# Patient Record
Sex: Male | Born: 1970
Health system: Southern US, Community
[De-identification: ages and names within clinical notes are randomized; demographics above are authoritative.]

## PROBLEM LIST (undated history)

## (undated) DIAGNOSIS — K5090 Crohn's disease, unspecified, without complications: Secondary | ICD-10-CM

## (undated) DIAGNOSIS — G4733 Obstructive sleep apnea (adult) (pediatric): Secondary | ICD-10-CM

## (undated) DIAGNOSIS — K5790 Diverticulosis of intestine, part unspecified, without perforation or abscess without bleeding: Secondary | ICD-10-CM

## (undated) DIAGNOSIS — R7989 Other specified abnormal findings of blood chemistry: Secondary | ICD-10-CM

## (undated) DIAGNOSIS — D131 Benign neoplasm of stomach: Secondary | ICD-10-CM

## (undated) DIAGNOSIS — G7000 Myasthenia gravis without (acute) exacerbation: Secondary | ICD-10-CM

## (undated) DIAGNOSIS — E538 Deficiency of other specified B group vitamins: Secondary | ICD-10-CM

## (undated) DIAGNOSIS — E669 Obesity, unspecified: Secondary | ICD-10-CM

## (undated) DIAGNOSIS — K219 Gastro-esophageal reflux disease without esophagitis: Secondary | ICD-10-CM

## (undated) DIAGNOSIS — K2270 Barrett's esophagus without dysplasia: Secondary | ICD-10-CM

## (undated) DIAGNOSIS — I10 Essential (primary) hypertension: Secondary | ICD-10-CM

## (undated) DIAGNOSIS — K2970 Gastritis, unspecified, without bleeding: Secondary | ICD-10-CM

## (undated) DIAGNOSIS — F32A Depression, unspecified: Secondary | ICD-10-CM

## (undated) DIAGNOSIS — L0291 Cutaneous abscess, unspecified: Secondary | ICD-10-CM

## (undated) DIAGNOSIS — J309 Allergic rhinitis, unspecified: Secondary | ICD-10-CM

## (undated) DIAGNOSIS — K2210 Ulcer of esophagus without bleeding: Secondary | ICD-10-CM

## (undated) HISTORY — DX: Essential (primary) hypertension: I10

## (undated) HISTORY — DX: Ulcer of esophagus without bleeding: K22.10

## (undated) HISTORY — DX: Cutaneous abscess, unspecified: L02.91

## (undated) HISTORY — DX: Obstructive sleep apnea (adult) (pediatric): G47.33

## (undated) HISTORY — DX: Gastro-esophageal reflux disease without esophagitis: K21.9

## (undated) HISTORY — DX: Crohn's disease, unspecified, without complications: K50.90

## (undated) HISTORY — DX: Other specified abnormal findings of blood chemistry: R79.89

## (undated) HISTORY — DX: Deficiency of other specified B group vitamins: E53.8

## (undated) HISTORY — DX: Barrett's esophagus without dysplasia: K22.70

## (undated) HISTORY — DX: Obesity, unspecified: E66.9

## (undated) HISTORY — DX: Diverticulosis of intestine, part unspecified, without perforation or abscess without bleeding: K57.90

## (undated) HISTORY — DX: Myasthenia gravis without (acute) exacerbation: G70.00

## (undated) HISTORY — DX: Gastritis, unspecified, without bleeding: K29.70

## (undated) HISTORY — DX: Allergic rhinitis, unspecified: J30.9

## (undated) HISTORY — DX: Depression, unspecified: F32.A

## (undated) HISTORY — DX: Benign neoplasm of stomach: D13.1

---

## 2005-06-17 ENCOUNTER — Ambulatory Visit: Payer: Self-pay | Admitting: Internal Medicine

## 2008-03-15 ENCOUNTER — Encounter: Admission: RE | Admit: 2008-03-15 | Discharge: 2008-03-15 | Payer: Self-pay | Admitting: Internal Medicine

## 2010-08-25 ENCOUNTER — Encounter (INDEPENDENT_AMBULATORY_CARE_PROVIDER_SITE_OTHER): Payer: Self-pay | Admitting: General Surgery

## 2010-08-25 ENCOUNTER — Ambulatory Visit (INDEPENDENT_AMBULATORY_CARE_PROVIDER_SITE_OTHER): Payer: BC Managed Care – PPO | Admitting: General Surgery

## 2010-08-25 VITALS — BP 124/82 | HR 70 | Temp 99.0°F | Resp 20 | Wt 189.8 lb

## 2010-08-25 DIAGNOSIS — L0291 Cutaneous abscess, unspecified: Secondary | ICD-10-CM

## 2010-08-25 DIAGNOSIS — L02219 Cutaneous abscess of trunk, unspecified: Secondary | ICD-10-CM

## 2010-08-25 DIAGNOSIS — L02213 Cutaneous abscess of chest wall: Secondary | ICD-10-CM

## 2010-08-25 DIAGNOSIS — L03319 Cellulitis of trunk, unspecified: Secondary | ICD-10-CM

## 2010-08-25 MED ORDER — HYDROCODONE-ACETAMINOPHEN 5-500 MG PO TABS: 1.0000 | ORAL_TABLET | ORAL | Status: AC | PRN

## 2010-08-25 NOTE — Patient Instructions (Signed)
Daily wound packing twice daily. Return to ER for increased pain or redness.

## 2010-08-25 NOTE — Progress Notes (Signed)
Roy Ross is a 40 y.o. male.    Chief Complaint  Patient presents with  . Cyst    Abscess-chest wall    HPI HPI This patient was referred by Dr. polite for evaluation of acute left chest skin abscess. He has a history of prior chest cyst which was infected and it ruptured spontaneously in 2005. This most recent episode began approximately 1 week ago when he began having increased pain tenderness and redness around the left chest cyst which he states has been present for at least 12 years previously. He was seen in the urgent care where the cyst was aspirated and he was placed on doxycycline. He's been taking antibiotics but states that the lesion has increased in size and continues to cause discomfort with palpation. He denies any fevers. He had some drainage from the area after the prior aspiration but has not had drainage previously.  Past Medical History  Diagnosis Date  . Hypertension     No past surgical history on file.  No family history on file.  Social History History  Substance Use Topics  . Smoking status: Former Smoker    Types: Cigarettes    Quit date: 09/28/2006  . Smokeless tobacco: Not on file  . Alcohol Use: No    Allergies  Allergen Reactions  . Penicillins     Pt was told as child    Current Outpatient Prescriptions  Medication Sig Dispense Refill  . HYDROcodone-acetaminophen (VICODIN) 5-500 MG per tablet Take 1 tablet by mouth every 4 (four) hours as needed for pain.  30 tablet  0    Review of Systems Review of Systems  Constitutional: Negative.   HENT: Negative.   Eyes: Negative.   Respiratory: Negative.   Cardiovascular: Negative.   Gastrointestinal: Negative.   Genitourinary: Negative.   Musculoskeletal: Negative.   Skin: Negative.   Neurological: Negative.   Endo/Heme/Allergies: Negative.   Psychiatric/Behavioral: Negative.     Physical Exam Physical Exam  Constitutional: He is oriented to person, place, and time. He  appears well-developed and well-nourished. No distress.  HENT:  Head: Normocephalic and atraumatic.  Eyes: Pupils are equal, round, and reactive to light. Right eye exhibits no discharge. Left eye exhibits no discharge. No scleral icterus.  Neck: Normal range of motion. Neck supple. No tracheal deviation present.  Cardiovascular: Normal rate, regular rhythm and normal heart sounds.   Respiratory: Effort normal and breath sounds normal. No stridor. No respiratory distress. He has no wheezes. He has no rales.  GI: Soft. Bowel sounds are normal. He exhibits no distension. There is no tenderness.  Musculoskeletal: Normal range of motion. He exhibits no edema and no tenderness.  Neurological: He is alert and oriented to person, place, and time. He has normal reflexes.  Skin: Skin is warm and dry. No rash noted. He is not diaphoretic. There is erythema. No pallor.       3cm x 3cm left chest abscess, with central fluctuance and surrounding erythema, I/D performed today at the bedside. Wound packed and dressed.     Blood pressure 124/82, pulse 70, temperature 99 F (37.2 C), temperature source Temporal, resp. rate 20, weight 189 lb 12.8 oz (86.093 kg).  Assessment/Plan Left chest abscess with cellulitis  Incision and drainage of the left chest abscess was performed today in the clinic with improvement of his symptoms. After risks and benefits of the procedure were discussed with the patient and informed consent was obtained, the skin prepped with Betadine solution. It  was then anesthetized with 1% lidocaine with epinephrine and a dime sized area of skin was taken over the central fluctuance and the wound was probed and irrigated and packed with half-inch Nu Gauze. The wound is hemostatic and dressed with pressure dressing. There were no apparent complications and the patient tolerated procedure well. Wound cultures were taken.  He will followup tomorrow for further wound care, wound care instruction,  and wound packing. After that time he should be able to take of this wound at home. After the acute infection is resolved and the wound is healed we will plan for elective excision of this infected epidermal inclusion cyst.  Rejeana Fadness Herald 08/25/2010, 5:30 PM

## 2010-08-26 ENCOUNTER — Other Ambulatory Visit (INDEPENDENT_AMBULATORY_CARE_PROVIDER_SITE_OTHER): Payer: Self-pay | Admitting: General Surgery

## 2010-08-26 ENCOUNTER — Encounter (INDEPENDENT_AMBULATORY_CARE_PROVIDER_SITE_OTHER): Payer: BC Managed Care – PPO

## 2010-08-27 ENCOUNTER — Ambulatory Visit (INDEPENDENT_AMBULATORY_CARE_PROVIDER_SITE_OTHER): Payer: BC Managed Care – PPO

## 2010-08-29 LAB — WOUND CULTURE: Organism ID, Bacteria: NO GROWTH

## 2010-08-31 ENCOUNTER — Ambulatory Visit (INDEPENDENT_AMBULATORY_CARE_PROVIDER_SITE_OTHER): Payer: BC Managed Care – PPO | Admitting: General Surgery

## 2010-08-31 ENCOUNTER — Encounter (INDEPENDENT_AMBULATORY_CARE_PROVIDER_SITE_OTHER): Payer: Self-pay | Admitting: General Surgery

## 2010-08-31 VITALS — BP 132/96 | HR 60 | Temp 98.6°F

## 2010-08-31 DIAGNOSIS — L089 Local infection of the skin and subcutaneous tissue, unspecified: Secondary | ICD-10-CM

## 2010-08-31 DIAGNOSIS — L723 Sebaceous cyst: Secondary | ICD-10-CM

## 2010-08-31 NOTE — Progress Notes (Signed)
Subjective:     Patient ID: Roy Ross, male   DOB: [birth year], 40 y.o.   MRN: [id number]  HPI He follows up today status post incision and drainage of left chest abscess. He has been doing his wound care with daily wet to dry dressings. The wound is healing well and he has no complaints. Denies any fevers, or chills. He is taking doxycycline which he just filled a second prescription.  Review of Systems     Objective:   Physical Exam The wound is healing well. There is no residual sign of infection. He has a 1.5 cm x 1.5 cm wide shallow cavity which is not really amenable to further wound packing. Again this is healing nicely and should completely resolve soon. There is no evidence of undrained fluid collection    Assessment:     S/p I/D of infected sebaceous cyst-Doing well     Plan:     He is doing well. The abscess cavity it is nearly healed. Minimal wound care should be needed now. I suspect that this will be completely healed within the next 2 weeks. I recommended that he followup in 2 months for further evaluation and to schedule elective sebaceous cyst excision to prevent recurrent infection since this is his second episode.

## 2010-10-16 ENCOUNTER — Encounter (INDEPENDENT_AMBULATORY_CARE_PROVIDER_SITE_OTHER): Payer: Self-pay | Admitting: General Surgery

## 2011-02-04 ENCOUNTER — Ambulatory Visit (INDEPENDENT_AMBULATORY_CARE_PROVIDER_SITE_OTHER): Payer: BC Managed Care – PPO

## 2011-02-04 DIAGNOSIS — B356 Tinea cruris: Secondary | ICD-10-CM

## 2011-02-15 ENCOUNTER — Ambulatory Visit (INDEPENDENT_AMBULATORY_CARE_PROVIDER_SITE_OTHER): Payer: BC Managed Care – PPO

## 2011-02-15 DIAGNOSIS — H6120 Impacted cerumen, unspecified ear: Secondary | ICD-10-CM

## 2011-02-15 DIAGNOSIS — H9209 Otalgia, unspecified ear: Secondary | ICD-10-CM

## 2011-06-01 ENCOUNTER — Ambulatory Visit (INDEPENDENT_AMBULATORY_CARE_PROVIDER_SITE_OTHER): Payer: BC Managed Care – PPO | Admitting: Family Medicine

## 2011-06-01 ENCOUNTER — Ambulatory Visit: Payer: BC Managed Care – PPO

## 2011-06-01 VITALS — BP 132/83 | HR 70 | Temp 98.6°F | Resp 16 | Ht 66.5 in | Wt 192.8 lb

## 2011-06-01 DIAGNOSIS — S161XXA Strain of muscle, fascia and tendon at neck level, initial encounter: Secondary | ICD-10-CM

## 2011-06-01 DIAGNOSIS — M542 Cervicalgia: Secondary | ICD-10-CM

## 2011-06-01 DIAGNOSIS — S139XXA Sprain of joints and ligaments of unspecified parts of neck, initial encounter: Secondary | ICD-10-CM

## 2011-06-01 MED ORDER — OXAPROZIN 600 MG PO TABS: 600.0000 mg | ORAL_TABLET | Freq: Two times a day (BID) | ORAL | Status: AC

## 2011-06-01 MED ORDER — METHOCARBAMOL 750 MG PO TABS: 750.0000 mg | ORAL_TABLET | Freq: Four times a day (QID) | ORAL | Status: AC
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
## 2011-06-01 NOTE — Patient Instructions (Signed)

## 2011-07-06 ENCOUNTER — Ambulatory Visit: Payer: BC Managed Care – PPO | Admitting: Family Medicine

## 2011-07-06 VITALS — BP 123/75 | HR 78 | Temp 98.6°F | Resp 16 | Ht 68.5 in | Wt 198.6 lb

## 2011-07-06 DIAGNOSIS — Z0289 Encounter for other administrative examinations: Secondary | ICD-10-CM

## 2011-07-06 NOTE — Progress Notes (Signed)
  Subjective:    Patient ID: Glennie Hawk, male    DOB: 02-01-1971, 41 y.o.   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  Has hypertension - on norvasc.  Well-controlled.  No concerns.    Review of Systems Negative except as per HPI     Objective:   Physical Exam  Constitutional: Vital signs are normal. He appears well-developed and well-nourished. He is active.  HENT:  Head: Normocephalic.  Right Ear: Hearing, tympanic membrane, external ear and ear canal normal.  Left Ear: Hearing, tympanic membrane, external ear and ear canal normal.  Nose: Nose normal.  Mouth/Throat: Uvula is midline, oropharynx is clear and moist and mucous membranes are normal.  Eyes: Conjunctivae and EOM are normal. Pupils are equal, round, and reactive to light.  Neck: No mass and no thyromegaly present.  Cardiovascular: Normal rate, regular rhythm, normal heart sounds, intact distal pulses and normal pulses.   Pulmonary/Chest: Effort normal and breath sounds normal.  Abdominal: Soft. Normal appearance and bowel sounds are normal. There is no hepatosplenomegaly. There is no tenderness. There is no CVA tenderness. No hernia. Hernia confirmed negative in the right inguinal area and confirmed negative in the left inguinal area.  Genitourinary: Testes normal and penis normal.  Lymphadenopathy:    He has no cervical adenopathy.    He has no axillary adenopathy.  Neurological: He is alert.          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

## 2012-01-20 ENCOUNTER — Ambulatory Visit (INDEPENDENT_AMBULATORY_CARE_PROVIDER_SITE_OTHER): Payer: BC Managed Care – PPO | Admitting: Physician Assistant

## 2012-01-20 VITALS — BP 146/87 | HR 72 | Temp 98.1°F | Resp 16 | Ht 67.0 in | Wt 201.4 lb

## 2012-01-20 DIAGNOSIS — S01309A Unspecified open wound of unspecified ear, initial encounter: Secondary | ICD-10-CM

## 2012-01-20 DIAGNOSIS — H6120 Impacted cerumen, unspecified ear: Secondary | ICD-10-CM

## 2012-01-20 DIAGNOSIS — H9190 Unspecified hearing loss, unspecified ear: Secondary | ICD-10-CM

## 2012-01-20 MED ORDER — MUPIROCIN 2 % EX OINT: TOPICAL_OINTMENT | Freq: Three times a day (TID) | CUTANEOUS | Status: DC

## 2012-01-20 NOTE — Patient Instructions (Addendum)
Discontinue use of Q-tips, hair pins, keys, etc.   To help prevent cerumen impaction: While in the washing hair in the shower, allow soapy water to run into ear canal for a few minutes and then rinse (it dissolves the wax like a dishwasher dissolves grease).   May also use half hydrogen peroxide half water in the shower 2-3 times per week to help rinse out the wax. DO NOT USE 100% HYDROGEN PEROXIDE (this can burn the skin).   Also, several drops of sweet oil can be used to help prevent itching of the canal. If this is not enough to decrease itch, you may put a small amount of OTC hydrocortisone cream on pinky finger and apply to portion of canal that can be reached with tip of finger.  Recommend OTC Debrox or Colace to prevent impaction.  

## 2012-01-20 NOTE — Progress Notes (Signed)
  Subjective:    Patient ID: Roy Ross, male    DOB: 1970-03-03, 41 y.o.   MRN: [id number]  HPI 41 year old male presents with 4-5 day history of right ear fullness, pressure, and decreased hearing.  Has had a history of problems with cerumen impaction in the past and has had to have them irrigated. States his left ear is not bothering him but likely could be impacted as well.  Denies nasal congestion, rhinorrhea, cough, postnasal drainage, fevers, chills, headache, tinnitus, or dizziness.  He also complains of dry skin on bilateral ears. Typically uses nizoral shampoo and a steroid cream that helps.  States behind his right auricle there is an open area that seems to be worsening. He has been using his creams but thinks it may be infected.  Is concerned about staph since he has had this in the past.     Review of Systems  Constitutional: Negative for fever and chills.  HENT: Negative for ear pain, congestion, sore throat, rhinorrhea, postnasal drip, tinnitus and ear discharge.   Respiratory: Negative for cough.   Gastrointestinal: Negative for nausea and vomiting.  Skin: Positive for rash (behind right ear) and wound.  Neurological: Negative for dizziness, light-headedness and headaches.  All other systems reviewed and are negative.       Objective:   Physical Exam  Constitutional: He is oriented to person, place, and time. He appears well-developed and well-nourished.  HENT:  Head: Normocephalic and atraumatic.  Right Ear: Hearing normal.  Left Ear: Hearing normal.       Right posterior auricle has erythematous, honey-colored crust with slight purulent drainage. Bilateral ears are dry and scaly.   Cerumen impaction bilaterally. Normal TM's revealed s/p ear irrigation.  Eyes: Conjunctivae normal and EOM are normal.  Neck: Normal range of motion. Neck supple.  Cardiovascular: Normal rate.   Pulmonary/Chest: Effort normal.  Lymphadenopathy:    He has no cervical adenopathy.   Neurological: He is alert and oriented to person, place, and time.  Psychiatric: He has a normal mood and affect. His behavior is normal. Judgment and thought content normal.          Assessment & Plan:   1. Wound, open, ear  Wound culture  2. Cerumen impaction    Ear care instructions provided D/C steroid cream Mupirocin bid x 7 days Culture sent Recommend OTC moisturizer to help with dry skin.  Will send in rx for doxycycline if no improvement with mupirocin at the time of culture results.

## 2012-01-25 LAB — WOUND CULTURE: Gram Stain: NONE SEEN

## 2012-01-27 ENCOUNTER — Telehealth: Payer: Self-pay

## 2012-01-27 MED ORDER — DOXYCYCLINE HYCLATE 100 MG PO TABS: 100.0000 mg | ORAL_TABLET | Freq: Two times a day (BID) | ORAL | Status: DC

## 2012-01-27 NOTE — Telephone Encounter (Signed)
Rx for doxy sent to pharmacy.

## 2012-01-27 NOTE — Telephone Encounter (Signed)
Your note indicates if not better will send in doxy? okay

## 2012-01-27 NOTE — Telephone Encounter (Signed)
Pt still not any better and would like something different called in   Best number [phone]

## 2012-01-28 NOTE — Telephone Encounter (Signed)
Thanks I have called him to advise.  

## 2012-07-06 ENCOUNTER — Ambulatory Visit: Payer: Self-pay | Admitting: Family Medicine

## 2012-07-06 VITALS — BP 122/80 | HR 78 | Temp 97.5°F | Resp 16 | Ht 67.0 in | Wt 206.0 lb

## 2012-07-06 DIAGNOSIS — Z0289 Encounter for other administrative examinations: Secondary | ICD-10-CM

## 2012-07-06 DIAGNOSIS — I10 Essential (primary) hypertension: Secondary | ICD-10-CM

## 2012-07-06 DIAGNOSIS — F411 Generalized anxiety disorder: Secondary | ICD-10-CM

## 2012-07-06 DIAGNOSIS — Z029 Encounter for administrative examinations, unspecified: Secondary | ICD-10-CM

## 2012-07-06 HISTORY — DX: Generalized anxiety disorder: F41.1

## 2012-07-06 HISTORY — DX: Essential (primary) hypertension: I10

## 2012-07-06 LAB — POCT URINALYSIS DIPSTICK
Glucose, UA: NEGATIVE
Ketones, UA: NEGATIVE
Protein, UA: NEGATIVE
Urobilinogen, UA: NEGATIVE

## 2012-07-06 NOTE — Progress Notes (Signed)
[facility] [address] [place] [address] [phone]  Date:  07/06/2012   Name:  Roy Ross   DOB:  11-29-1970   MRN:  [id number]  PCP:  Georgann Housekeeper, MD    Chief Complaint: No chief complaint on file.   History of Present Illness:  Roy Ross is a 42 y.o. very pleasant male patient who presents with the following: Patient presents today for a DOT physical exam. No complaints today- states that he feels fine. 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  He takes norvasc for HTN, and celexa for anxiety.  His anxiety is not severe, it is well controlled with his medication per his PCP Dr. Eula Listen.  He has never been hospitalized or had severe anxiety symptoms.   There are no active problems to display for this patient.   Past Medical History  Diagnosis Date  . Hypertension   . Abscess     pectoralis major    No past surgical history on file.  History  Substance Use Topics  . Smoking status: Former Smoker    Types: Cigarettes    Quit date: 09/28/2006  . Smokeless tobacco: Not on file  . Alcohol Use: No    Family History  Problem Relation Age of Onset  . Hypertension Father   . Heart disease Father     pacemaker  . Diabetes Father   . Cancer Father     bladder    Allergies  Allergen Reactions  . Penicillins     Pt was told as child    Medication list has been reviewed and updated.  Current Outpatient Prescriptions on File Prior to Visit  Medication Sig Dispense Refill  . amLODipine (NORVASC) 5 MG tablet Daily.      . citalopram (CELEXA) 40 MG tablet Daily.      Marland Kitchen doxycycline (VIBRA-TABS) 100 MG tablet Take 1 tablet (100 mg total) by mouth 2 (two) times daily.  20 tablet  0  . mupirocin ointment (BACTROBAN) 2 % Apply topically 3 (three) times daily.  22 g  0   No current facility-administered medications on file prior to visit.    Review of Systems:  Patient denies headache, maise,  fever. Denies chest pain, shortness of breath, difficulty with ROM. Denies tremor, syncope, and dizziness. No complaints. Review of symptoms otherwise negative. Patient states that he compliant with his blood pressure and anti-anxiety medications. States that both medications are helpful as used for. Denies anxiety interfering with work.    Physical Examination: Filed Vitals:   07/06/12 1601  BP: 122/80  Pulse: 78  Temp: 97.5 F (36.4 C)  Resp: 16   Filed Vitals:   07/06/12 1601  Height: 5\' 7"  (1.702 m)  Weight: 206 lb (93.441 kg)   Body mass index is 32.26 kg/(m^2). Ideal Body Weight: Weight in (lb) to have BMI = 25: 159.3  GEN: WDWN, NAD, Non-toxic, A & O x 3 HEENT: Atraumatic, Normocephalic. Neck supple. No masses, No LAD.  Bilateral TM wnl, oropharynx normal.  PEERL,EOMI.   Ears and Nose: No external deformity. CV: RRR, No M/G/R. No JVD. No thrill. No extra heart sounds. PULM: CTA B, no wheezes, crackles, rhonchi. No retractions. No resp. distress. No accessory muscle use. ABD: S, NT, ND. No rebound. No HSM. EXTR: No c/c/e NEURO Normal gait.  Normal strength, ROM and DTR all extremities PSYCH: Normally interactive. Conversant. Not depressed or anxious appearing.  Calm demeanor.  GU: no inguinal hernia, genitalia normal  Assessment and Plan: Assessment: DOT EXAM  Plan: Continue with yearly physical & immunizations per schedule           Continue with medications as ordered          Follow up with PCP with any complaints as needed Alerted to trace hematuria on dip today- asked him to please follow- up with Dr. Eula Listen regarding this finding.  It may need to be repeated  Signed Abbe Amsterdam, MD

## 2012-08-26 ENCOUNTER — Encounter: Payer: Self-pay | Admitting: Family Medicine

## 2012-09-01 ENCOUNTER — Ambulatory Visit (INDEPENDENT_AMBULATORY_CARE_PROVIDER_SITE_OTHER): Payer: BC Managed Care – PPO | Admitting: Emergency Medicine

## 2012-09-01 VITALS — BP 152/82 | HR 74 | Temp 98.0°F | Resp 16 | Ht 67.0 in | Wt 207.0 lb

## 2012-09-01 DIAGNOSIS — N50811 Right testicular pain: Secondary | ICD-10-CM

## 2012-09-01 DIAGNOSIS — N509 Disorder of male genital organs, unspecified: Secondary | ICD-10-CM

## 2012-09-01 LAB — POCT UA - MICROSCOPIC ONLY
Casts, Ur, LPF, POC: NEGATIVE
Crystals, Ur, HPF, POC: NEGATIVE
Epithelial cells, urine per micros: NEGATIVE
Mucus, UA: NEGATIVE

## 2012-09-01 LAB — POCT URINALYSIS DIPSTICK
Protein, UA: 30
Spec Grav, UA: >=1.03
Urobilinogen, UA: 0.2
pH, UA: 5.5

## 2012-09-01 MED ORDER — DOXYCYCLINE HYCLATE 100 MG PO TABS: 100.0000 mg | ORAL_TABLET | Freq: Two times a day (BID) | ORAL | Status: DC

## 2012-09-01 NOTE — Progress Notes (Signed)
  Subjective:    Patient ID: Roy Ross, male    DOB: 10-02-1970, 42 y.o.   MRN: [id number]  HPI Pt has had some pain in his right testicle. THis has been going on for the last 2 weeks. He doesn't think that it is swelling. No pain with urination. He says that when he saw Dr Patsy Lager last time, she said he had blood in his urine. He does a lot of heavy lifting, pushing things around at work.    Review of Systems     Objective:   Physical Exam abdomen is soft. Liver spleen are not large. Examination of genitourinary reveals a normal genitals. Testes are normal. There is no hernia felt. There is tenderness along the cord the right testicle  Results for orders placed in visit on 07/06/12  POCT URINALYSIS DIPSTICK      Result Value Range   Color, UA yellow     Clarity, UA clear     Glucose, UA neg     Bilirubin, UA neg     Ketones, UA neg     Spec Grav, UA 1.015     Blood, UA trace     pH, UA       Protein, UA neg     Urobilinogen, UA negative     Nitrite, UA neg     Leukocytes, UA Negative     Prostate exam reveals a normal-sized prostate which is nontender .      Assessment & Plan:  Patient has evidence of a right epididymitis. Will treat with doxycycline and schedule ultrasound of the right testicle.

## 2012-09-05 ENCOUNTER — Other Ambulatory Visit: Payer: Self-pay | Admitting: Radiology

## 2012-09-05 DIAGNOSIS — N50819 Testicular pain, unspecified: Secondary | ICD-10-CM

## 2012-10-10 ENCOUNTER — Telehealth: Payer: Self-pay

## 2012-10-10 NOTE — Telephone Encounter (Signed)
Pt states we referred him for an ultrasound and he cancelled it due to the pain going away,but now it has returned and he would like to be referred again

## 2012-10-11 NOTE — Telephone Encounter (Signed)
Please reschedule his testicular ultrasound and Doppler.

## 2012-10-11 NOTE — Telephone Encounter (Signed)
Checking with Lupita Leash to see if he can reschedule without another referral.

## 2013-02-22 ENCOUNTER — Emergency Department (HOSPITAL_COMMUNITY)
Admission: EM | Admit: 2013-02-22 | Discharge: 2013-02-22 | Disposition: A | Discharge status: Home / Self Care | Payer: BC Managed Care – PPO | Source: Home / Self Care | Attending: Family Medicine | Admitting: Family Medicine

## 2013-02-22 ENCOUNTER — Encounter (HOSPITAL_COMMUNITY): Payer: Self-pay | Admitting: Emergency Medicine

## 2013-02-22 DIAGNOSIS — S91209A Unspecified open wound of unspecified toe(s) with damage to nail, initial encounter: Secondary | ICD-10-CM

## 2013-02-22 DIAGNOSIS — W240XXA Contact with lifting devices, not elsewhere classified, initial encounter: Secondary | ICD-10-CM

## 2013-02-22 DIAGNOSIS — S91109A Unspecified open wound of unspecified toe(s) without damage to nail, initial encounter: Secondary | ICD-10-CM

## 2013-02-22 NOTE — ED Provider Notes (Signed)
Medical screening examination/treatment/procedure(s) were performed by resident physician or non-physician practitioner and as supervising physician I was immediately available for consultation/collaboration.   Londen Bok DOUGLAS MD.   Elisheba Mcdonnell D Marielouise Amey, MD 02/22/13 2056 

## 2013-02-22 NOTE — ED Provider Notes (Signed)
CSN: [id number]     Arrival date & time 02/22/13  1657 History   First MD Initiated Contact with Patient 02/22/13 1727     Chief Complaint  Patient presents with  . Toe Injury   (Consider location/radiation/quality/duration/timing/severity/associated sxs/prior Treatment) HPI Comments: 43 year old male presents complaining of injury to his right great toe. This morning he was using a dolly and it rolled onto his toe and bent his toenail back. The pain is mild to moderate. There was a small amount of bleeding initially but that has stopped. No other injury. The pain is not increased with ambulation. There is no numbness in the toe. He does not think it is broken    Past Medical History  Diagnosis Date  . Hypertension   . Abscess     pectoralis major   History reviewed. No pertinent past surgical history. Family History  Problem Relation Age of Onset  . Hypertension Father   . Heart disease Father     pacemaker  . Diabetes Father   . Cancer Father     bladder   History  Substance Use Topics  . Smoking status: Former Smoker    Types: Cigarettes    Quit date: 09/28/2006  . Smokeless tobacco: Not on file  . Alcohol Use: No    Review of Systems  Constitutional: Negative for fever, chills and fatigue.  HENT: Negative for sore throat.   Eyes: Negative for visual disturbance.  Respiratory: Negative for cough and shortness of breath.   Cardiovascular: Negative for chest pain, palpitations and leg swelling.  Gastrointestinal: Negative for nausea, vomiting, abdominal pain, diarrhea and constipation.  Genitourinary: Negative for dysuria, urgency, frequency and hematuria.  Musculoskeletal: Positive for arthralgias. Negative for myalgias, neck pain and neck stiffness.       Toenail injury  Skin: Negative for rash.  Neurological: Negative for dizziness, weakness and light-headedness.    Allergies  Penicillins  Home Medications   Current Outpatient Rx  Name  Route  Sig  Dispense   Refill  . amLODipine (NORVASC) 5 MG tablet      Daily.         . citalopram (CELEXA) 40 MG tablet      Daily.         Marland Kitchen doxycycline (VIBRA-TABS) 100 MG tablet   Oral   Take 1 tablet (100 mg total) by mouth 2 (two) times daily.   20 tablet   0   . mupirocin ointment (BACTROBAN) 2 %   Topical   Apply topically 3 (three) times daily.   22 g   0    BP 139/81  Pulse 78  Temp(Src) 98.8 F (37.1 C) (Oral)  Resp 16  SpO2 97% Physical Exam  Nursing note and vitals reviewed. Constitutional: He is oriented to person, place, and time. He appears well-developed and well-nourished. No distress.  HENT:  Head: Normocephalic.  Pulmonary/Chest: Effort normal. No respiratory distress.  Musculoskeletal:       Feet:  Neurological: He is alert and oriented to person, place, and time. Coordination normal.  Skin: Skin is warm and dry. No rash noted. He is not diaphoretic.  Psychiatric: He has a normal mood and affect. Judgment normal.    ED Course  Procedures (including critical care time) Labs Review Labs Reviewed - No data to display Imaging Review No results found.    MDM   1. Toenail avulsion    End of toenail removed, no anesthesia required.  Dressed.  Tylenol or IB2 PRN.  Keep covered, f/u if any signs of infection.         Graylon GoodZachary H Shey Yott, PA-C 02/22/13 [phone]

## 2013-02-22 NOTE — Discharge Instructions (Signed)
Fingernail or Toenail Loss  All or part of your fingernail or toenail has been lost. This may or may not grow back as a normal nail. A special non-stick bandage has been put on your finger or toe tightly to prevent bleeding.  HOME CARE INSTRUCTIONS   The tips of fingers and toes are full of nerves and injuries are often very painful. The following will help you decrease the pain and obtain the best outcome.  · Keep your hand or foot elevated above your heart to relieve pain and swelling. This will require lying in bed or on a couch with the hand or leg on pillows or sitting in a recliner with the leg up. Letting your hand or leg dangle may increase swelling, slow healing and cause throbbing pain.  · Keep your dressing dry and clean.  · Change your bandage in 24 hours after going home.  · After your bandage is changed, soak your hand or foot in warm soapy water for 10 to 20 minutes. Do this 3 times per day. This helps reduce pain and swelling. After soaking, apply a clean, dry bandage. Change your bandage if it is wet or dirty.  · Only take over-the-counter or prescription medicines for pain, discomfort, or fever as directed by your caregiver.  · See your caregiver as needed for problems.  SEEK IMMEDIATE MEDICAL CARE IF:   · You have increased pain, swelling, drainage, or bleeding.  · You have a fever.  MAKE SURE YOU:   · Understand these instructions.  · Will watch your condition.  · Will get help right away if you are not doing well or get worse.  Document Released: 12/17/2005 Document Revised: 04/19/2011 Document Reviewed: 03/08/2006  [employer]® Patient Information ©2014 [employer].

## 2013-02-22 NOTE — ED Notes (Signed)
Pt c/o right greater toe inj onset today around 1430 Reports he was moving a heavy copier via motorized dolly and his toe was caught in between Sxs include pain that is 5/10, bleeding and toe nail raised/partial avulsion  Alert w/no signs of acute distress.

## 2013-06-04 ENCOUNTER — Other Ambulatory Visit: Payer: Self-pay | Admitting: Internal Medicine

## 2013-06-04 ENCOUNTER — Ambulatory Visit
Admission: RE | Admit: 2013-06-04 | Discharge: 2013-06-04 | Disposition: A | Discharge status: Home / Self Care | Payer: BC Managed Care – PPO | Source: Ambulatory Visit | Attending: Internal Medicine | Admitting: Internal Medicine

## 2013-06-04 DIAGNOSIS — R05 Cough: Secondary | ICD-10-CM

## 2013-06-04 DIAGNOSIS — R059 Cough, unspecified: Secondary | ICD-10-CM

## 2013-07-03 ENCOUNTER — Ambulatory Visit: Payer: Self-pay | Admitting: Physician Assistant

## 2013-07-03 VITALS — BP 128/78 | HR 83 | Temp 98.5°F | Resp 18 | Ht 66.5 in | Wt 205.4 lb

## 2013-07-03 DIAGNOSIS — Z0289 Encounter for other administrative examinations: Secondary | ICD-10-CM

## 2013-07-03 NOTE — Progress Notes (Signed)
Patient ID: Roy Ross MRN: [id number], DOB: Apr 23, 1970 43 y.o. Date of Encounter: 07/03/2013, 5:10 PM  Primary Physician: Georgann HousekeeperHUSAIN,KARRAR, MD  Chief Complaint: DOT Physical   HPI: 43 y.o. male with history noted below here for DOT physical. Doing well. No issues/complaints. Recertification. Currently taking amlodipine 5 mg daily for HTN and Celexa 40 mg daily for anxiety. Tolerating both without issues. PCP is Dr. Donette LarryHusain. Last saw Dr. Donette LarryHusain 1 week ago for regular CPE. Labs were done, hasn't heard back on them yet. No CP, chest tightness, headaches, vision changes, or focal deficits. No SI or HI.   Just had a sleep study during winter and never heard anything. Finally got his CPAP a week ago, 06/28/13 and has been using it nightly since. Has follow up appointment with Dr. Donette LarryHusain in July to assess CPAP.   Review of Systems: Consitutional: No fever, chills, fatigue, night sweats, lymphadenopathy, or weight changes. Eyes: No visual changes, eye redness, or discharge. ENT/Mouth: Ears: No otalgia, tinnitus, hearing loss, discharge. Nose: No congestion, rhinorrhea, sinus pain, or epistaxis. Throat: No sore throat, post nasal drip, or teeth pain. Cardiovascular: No CP, palpitations, diaphoresis, DOE, edema, orthopnea, PND. Respiratory: No cough, hemoptysis, SOB, or wheezing. Gastrointestinal: No anorexia, dysphagia, reflux, pain, nausea, vomiting, hematemesis, diarrhea, constipation, BRBPR, or melena. Genitourinary: No dysuria, frequency, urgency, hematuria, incontinence, nocturia, decreased urinary stream, discharge, impotence, or testicular pain/masses. Musculoskeletal: No decreased ROM, myalgias, stiffness, joint swelling, or weakness. Skin: No rash, erythema, lesion changes, pain, warmth, jaundice, or pruritis. Neurological: No headache, dizziness, syncope, seizures, tremors, memory loss, coordination problems, or paresthesias. Psychological: Positive for well controlled anxiety. No  depression, hallucinations, SI/HI. Endocrine: No fatigue, polydipsia, polyphagia, polyuria, or known diabetes. All other systems were reviewed and are otherwise negative.  Past Medical History  Diagnosis Date  . Hypertension   . Abscess     pectoralis major     No past surgical history on file.  Home Meds:  Prior to Admission medications   Medication Sig Start Date End Date Taking? Authorizing Provider  amLODipine (NORVASC) 5 MG tablet Daily. 08/14/10  Yes Historical Provider, MD  citalopram (CELEXA) 40 MG tablet Daily. 08/10/10  Yes Historical Provider, MD    Allergies:  Allergies  Allergen Reactions  . Penicillins     Pt was told as child    History   Social History  . Marital Status: Single    Spouse Name: N/A    Number of Children: N/A  . Years of Education: N/A   Occupational History  . Not on file.   Social History Main Topics  . Smoking status: Former Smoker    Types: Cigarettes    Quit date: 09/28/2006  . Smokeless tobacco: Not on file  . Alcohol Use: No  . Drug Use: No  . Sexual Activity: Not Currently   Other Topics Concern  . Not on file   Social History Narrative  . No narrative on file    Family History  Problem Relation Age of Onset  . Hypertension Father   . Heart disease Father     pacemaker  . Diabetes Father   . Cancer Father     bladder    Physical Exam: Blood pressure 128/78, pulse 83, temperature 98.5 F (36.9 C), temperature source Oral, resp. rate 18, height 5' 6.5" (1.689 m), weight 205 lb 6.4 oz (93.169 kg), SpO2 99.00%.  General: Well developed, well nourished, in no acute distress. HEENT: Normocephalic, atraumatic. Conjunctiva pink, sclera non-icteric. Pupils 2  mm constricting to 1 mm, round, regular, and equally reactive to light and accomodation. EOMI. Internal auditory canal clear. TMs with good cone of light and without pathology. Nasal mucosa pink. Nares are without discharge. No sinus tenderness. Oral mucosa pink.  Dentition normal. Pharynx without exudate.   Neck: Supple. Trachea midline. No thyromegaly. Full ROM. No lymphadenopathy. Lungs: Clear to auscultation bilaterally without wheezes, rales, or rhonchi. Breathing is of normal effort and unlabored. Cardiovascular: RRR with S1 S2. No murmurs, rubs, or gallops appreciated. Distal pulses 2+ symmetrically. No carotid or abdominal bruits. Abdomen: Soft, non-tender, non-distended with normoactive bowel sounds. No hepatosplenomegaly or masses. No rebound/guarding. No CVA tenderness. Without hernias.  Genitourinary: Circumcised male. No penile lesions. Testes descended bilaterally, and smooth without tenderness or masses.  Musculoskeletal: Full range of motion and 5/5 strength throughout. Without swelling, atrophy, tenderness, crepitus, or warmth. Extremities without clubbing, cyanosis, or edema. Calves supple. Skin: Warm and moist without erythema, ecchymosis, wounds, or rash. Neuro: A+Ox3. CN II-XII grossly intact. Moves all extremities spontaneously. Full sensation throughout. Normal gait. DTR 2+ throughout upper and lower extremities. Finger to nose intact. Psych:  Responds to questions appropriately with a normal affect.    Assessment/Plan:  43 y.o. male here for DOT physical with hypertension, newly diagnosed sleep apnea, and anxiety. -Cleared -1 year card issued -Form completed -Follow up with PCP as directed -Advised patient to bring CPAP print out at next DOT exam -Ok for DOT today as he is only 5 days into his treatment course at this time -RTC prn  Signed, Eula Listen, MHS, PA-C [facility] [place], [place] [address] [phone] [facility] 07/03/2013 5:10 PM

## 2014-03-15 ENCOUNTER — Ambulatory Visit (INDEPENDENT_AMBULATORY_CARE_PROVIDER_SITE_OTHER): Payer: BLUE CROSS/BLUE SHIELD | Admitting: Physician Assistant

## 2014-03-15 ENCOUNTER — Ambulatory Visit (INDEPENDENT_AMBULATORY_CARE_PROVIDER_SITE_OTHER): Payer: BLUE CROSS/BLUE SHIELD

## 2014-03-15 VITALS — BP 122/78 | HR 93 | Temp 98.4°F | Resp 18 | Ht 66.5 in | Wt 217.8 lb

## 2014-03-15 DIAGNOSIS — R05 Cough: Secondary | ICD-10-CM

## 2014-03-15 DIAGNOSIS — R12 Heartburn: Secondary | ICD-10-CM

## 2014-03-15 DIAGNOSIS — L03313 Cellulitis of chest wall: Secondary | ICD-10-CM

## 2014-03-15 DIAGNOSIS — R053 Chronic cough: Secondary | ICD-10-CM

## 2014-03-15 MED ORDER — RANITIDINE HCL 150 MG PO TABS: 150.0000 mg | ORAL_TABLET | Freq: Two times a day (BID) | ORAL | Status: DC

## 2014-03-15 MED ORDER — CETIRIZINE HCL 10 MG PO TABS: 10.0000 mg | ORAL_TABLET | Freq: Every day | ORAL | Status: DC

## 2014-03-15 MED ORDER — DOXYCYCLINE HYCLATE 100 MG PO CAPS
100.0000 mg | ORAL_CAPSULE | Freq: Two times a day (BID) | ORAL | Status: AC
Start: 2014-03-15 — End: 2014-03-25

## 2014-03-15 NOTE — Patient Instructions (Signed)
Please take the zantac and cetirizine.  Please return to the clinic if you continue to have symptoms in 2 weeks.

## 2014-03-15 NOTE — Progress Notes (Signed)
Subjective:    Patient ID: Roy Ross, male    DOB: 1970/04/23, 44 y.o.   MRN: [id number]  Chief Complaint  Patient presents with  . Cough    Non-productive, since November     HPI 44 year old male non-smoker is here today for chief complaint of non-productive cough that has persisted for the last 3 months.  He describes it as a sensation as if he is drinking and the fluid goes down the wrong pipe-- but he is not drinking.  However he denies any dysphagia or regurgitation.  He states he has difficulty swallowing his pills in the morning, but if he takes them in the afternoon, he has no difficulty.  The coughing is not worse with lying down.  He denies fever, hemoptysis, or night sweats.   Patient states that he does have post nasal drip.  This has been chronically around this season but clears by summer.  He has no ear fullness or sore throat. Patient states that he does have some heartburn.  He eats spicy foods and caffeine.  He does not drink alcohol.  He is a non-smoker but does engage in chewing tobacco.  Patient is also concerned of skin irritation at the back of his left ear.  He states that there is some pain, but no blood or exudate.  He has no trauma to the area.  He states that he has a hx of MRSA at this site.  Patient is also concerned of a mass just beneath his left breast.  He has noticed some darkness at the area and feels hard.  He denies fever, and has minimal pain.  He has no exudate at site.  He has done noting for relief of these symptoms.  Patient has noted sensitive skin.  He has started using Purpose for facial skin.  He uses Gold Dial for body washing.        Past Medical History  Diagnosis Date  . Hypertension   . Abscess     pectoralis major   Family History  Problem Relation Age of Onset  . Hypertension Father   . Heart disease Father     pacemaker  . Diabetes Father   . Cancer Father     bladder   History   Social History  . Marital Status:  Single    Spouse Name: N/A    Number of Children: N/A  . Years of Education: N/A   Social History Main Topics  . Smoking status: Former Smoker    Types: Cigarettes    Quit date: 09/28/2006  . Smokeless tobacco: None  . Alcohol Use: No  . Drug Use: No  . Sexual Activity: Not Currently   Other Topics Concern  . None   Social History Narrative   Current Outpatient Prescriptions on File Prior to Visit  Medication Sig Dispense Refill  . amLODipine (NORVASC) 5 MG tablet Daily.    . citalopram (CELEXA) 40 MG tablet Daily.     No current facility-administered medications on file prior to visit.    Review of Systems     Objective:   Physical Exam  Constitutional: He appears well-developed. No distress.  BP 122/78 mmHg  Pulse 93  Temp(Src) 98.4 F (36.9 C) (Oral)  Resp 18  Ht 5' 6.5" (1.689 m)  Wt 217 lb 12.8 oz (98.793 kg)  BMI 34.63 kg/m2  SpO2 97%   HENT:  Head: Normocephalic and atraumatic.  Right Ear: Tympanic membrane, external ear  and ear canal normal.  Left Ear: Tympanic membrane, external ear and ear canal normal.  Nose: Mucosal edema, rhinorrhea and septal deviation present. Right sinus exhibits no maxillary sinus tenderness and no frontal sinus tenderness. Left sinus exhibits no maxillary sinus tenderness and no frontal sinus tenderness.  Mouth/Throat: No oropharyngeal exudate, posterior oropharyngeal edema or posterior oropharyngeal erythema.  Eyes: Conjunctivae are normal. Pupils are equal, round, and reactive to light. Right eye exhibits no discharge. Left eye exhibits no discharge.  Cardiovascular: Normal rate and regular rhythm.  Exam reveals no gallop, no distant heart sounds and no friction rub.   No murmur heard. Pulmonary/Chest: No accessory muscle usage. No apnea. No respiratory distress. He has no decreased breath sounds. He has no wheezes.  Lymphadenopathy:    He has no cervical adenopathy.  Skin: He is not diaphoretic.  Post-auricular fissure like  cut at the site.  There is no exudate or bleeding.  No tenderness with palpation.    Left breast line with hyperpigmented induration.  There is mild tenderness with palpation.  Punctate site not seen.    Multiple erythematous pustules along shoulders and back.    Psychiatric: He has a normal mood and affect. His behavior is normal.   [facility] reading (PRIMARY) by  Dr. Cleta Alberts: No acute disease.  Please comment on air shadow left abdomen  Stat reading CXR interpretation IMPRESSION: No evidence of acute cardiopulmonary disease    Assessment & Plan:  44 year old male is here today for chief complaint of persistent cough for about 3 months.  My suspicion is that the post nasal drip from untreated allergies and GERD are prolonging cough symptoms.  Patient advised to return within 2 weeks, if symptoms do not resolve with zantac zyrtec.  Due to patient hx of MRSA and multiple sites of possible infection, will treat with oral meds.    Persistent cough for 3 weeks or longer  -cetirizine (ZYRTEC) 10 MG tablet -ranitidine (ZANTAC) 150 MG tablet  Heartburn  -ranitidine (ZANTAC) 150 MG tablet BID  Cellulitis of chest wall  -doxycycline (VIBRAMYCIN) 100 MG capsule BID for 10 days  Trena Platt, PA-C [facility] 2/6/20168:59 PM

## 2014-12-18 ENCOUNTER — Other Ambulatory Visit: Payer: Self-pay | Admitting: Physician Assistant

## 2015-06-12 ENCOUNTER — Ambulatory Visit (INDEPENDENT_AMBULATORY_CARE_PROVIDER_SITE_OTHER): Payer: BLUE CROSS/BLUE SHIELD | Admitting: Physician Assistant

## 2015-06-12 VITALS — BP 148/80 | HR 74 | Temp 98.5°F | Resp 16 | Ht 66.5 in | Wt 209.0 lb

## 2015-06-12 DIAGNOSIS — H9203 Otalgia, bilateral: Secondary | ICD-10-CM | POA: Diagnosis not present

## 2015-06-12 DIAGNOSIS — R0981 Nasal congestion: Secondary | ICD-10-CM

## 2015-06-12 DIAGNOSIS — H6123 Impacted cerumen, bilateral: Secondary | ICD-10-CM

## 2015-06-12 NOTE — Patient Instructions (Signed)
     IF you received an x-ray today, you will receive an invoice from [facility]. Please contact [facility] at [phone] with questions or concerns regarding your invoice.   IF you received labwork today, you will receive an invoice from [facility]. Please contact [facility] at [phone] with questions or concerns regarding your invoice.   Our billing staff will not be able to assist you with questions regarding bills from these companies.  You will be contacted with the lab results as soon as they are available. The fastest way to get your results is to activate your My Chart account. Instructions are located on the last page of this paperwork. If you have not heard from us regarding the results in 2 weeks, please contact this office.      

## 2015-06-12 NOTE — Progress Notes (Signed)
   Roy Ross  MRN: [id number] DOB: 02/12/1970  Subjective:  Pt presents to clinic with bilateral ears clogged with the R>L.  He has some muffled hearing.  He thinks they need to be cleaned out.  He has not had any recent cold symptoms but has been having slightly congestion from what he thinks is allergies.    Home treatment - none  Patient Active Problem List   Diagnosis Date Noted  . HTN (hypertension) 07/06/2012  . Anxiety state, unspecified 07/06/2012    Current Outpatient Prescriptions on File Prior to Visit  Medication Sig Dispense Refill  . amLODipine (NORVASC) 5 MG tablet Daily.    . citalopram (CELEXA) 40 MG tablet Daily.     No current facility-administered medications on file prior to visit.    Allergies  Allergen Reactions  . Penicillins     Pt was told as child    Review of Systems  Constitutional: Negative for chills.  HENT: Positive for congestion, ear pain (sore - but more clogged) and hearing loss (muffled). Negative for ear discharge and postnasal drip.   Allergic/Immunologic: Positive for environmental allergies.   Objective:  BP 148/80 mmHg  Pulse 74  Temp(Src) 98.5 F (36.9 C) (Oral)  Resp 16  Ht 5' 6.5" (1.689 m)  Wt 209 lb (94.802 kg)  BMI 33.23 kg/m2  SpO2 96%  Physical Exam  Constitutional: He is oriented to person, place, and time and well-developed, well-nourished, and in no distress.  HENT:  Head: Normocephalic and atraumatic.  Right Ear: Hearing, tympanic membrane and external ear normal. A foreign body (cerumen impaction cleared with lavage) is present.  Left Ear: Hearing, tympanic membrane and external ear normal. A foreign body (cerumen impaction cleared with lavage) is present.  Nose: Nose normal.  Mouth/Throat: Uvula is midline, oropharynx is clear and moist and mucous membranes are normal.  Eyes: Conjunctivae are normal.  Neck: Normal range of motion.  Cardiovascular: Normal rate, regular rhythm and normal heart  sounds.   Pulmonary/Chest: Effort normal and breath sounds normal. He has no wheezes.  Lymphadenopathy:       Head (right side): No tonsillar adenopathy present.       Head (left side): No tonsillar adenopathy present.    He has no cervical adenopathy.       Right: No supraclavicular adenopathy present.       Left: No supraclavicular adenopathy present.  Neurological: He is alert and oriented to person, place, and time. Gait normal.  Skin: Skin is warm and dry.  Psychiatric: Mood, memory, affect and judgment normal.    Assessment and Plan :  Cerumen impaction, bilateral  Pt's symptoms resolved.  D/w pt how to prevent this is the future.  Answered his questions.   Benny Lennert PA-C  [facility] 06/12/2015 6:50 PM

## 2015-07-21 DIAGNOSIS — Z1389 Encounter for screening for other disorder: Secondary | ICD-10-CM | POA: Diagnosis not present

## 2015-07-21 DIAGNOSIS — Z0000 Encounter for general adult medical examination without abnormal findings: Secondary | ICD-10-CM | POA: Diagnosis not present

## 2015-11-20 DIAGNOSIS — L0292 Furuncle, unspecified: Secondary | ICD-10-CM | POA: Diagnosis not present

## 2015-11-20 DIAGNOSIS — L0390 Cellulitis, unspecified: Secondary | ICD-10-CM | POA: Diagnosis not present

## 2015-11-24 DIAGNOSIS — L03313 Cellulitis of chest wall: Secondary | ICD-10-CM | POA: Diagnosis not present

## 2015-12-10 DIAGNOSIS — L03313 Cellulitis of chest wall: Secondary | ICD-10-CM | POA: Diagnosis not present

## 2016-01-19 DIAGNOSIS — E349 Endocrine disorder, unspecified: Secondary | ICD-10-CM | POA: Diagnosis not present

## 2016-01-19 DIAGNOSIS — I10 Essential (primary) hypertension: Secondary | ICD-10-CM | POA: Diagnosis not present

## 2016-01-19 DIAGNOSIS — F325 Major depressive disorder, single episode, in full remission: Secondary | ICD-10-CM | POA: Diagnosis not present

## 2016-01-19 DIAGNOSIS — Z23 Encounter for immunization: Secondary | ICD-10-CM | POA: Diagnosis not present

## 2016-01-19 DIAGNOSIS — G4733 Obstructive sleep apnea (adult) (pediatric): Secondary | ICD-10-CM | POA: Diagnosis not present

## 2016-01-19 DIAGNOSIS — J309 Allergic rhinitis, unspecified: Secondary | ICD-10-CM | POA: Diagnosis not present

## 2016-07-27 DIAGNOSIS — J309 Allergic rhinitis, unspecified: Secondary | ICD-10-CM | POA: Diagnosis not present

## 2016-07-27 DIAGNOSIS — Z1389 Encounter for screening for other disorder: Secondary | ICD-10-CM | POA: Diagnosis not present

## 2016-07-27 DIAGNOSIS — Z136 Encounter for screening for cardiovascular disorders: Secondary | ICD-10-CM | POA: Diagnosis not present

## 2016-07-27 DIAGNOSIS — Z125 Encounter for screening for malignant neoplasm of prostate: Secondary | ICD-10-CM | POA: Diagnosis not present

## 2016-07-27 DIAGNOSIS — I10 Essential (primary) hypertension: Secondary | ICD-10-CM | POA: Diagnosis not present

## 2016-07-27 DIAGNOSIS — R7309 Other abnormal glucose: Secondary | ICD-10-CM | POA: Diagnosis not present

## 2016-07-27 DIAGNOSIS — F325 Major depressive disorder, single episode, in full remission: Secondary | ICD-10-CM | POA: Diagnosis not present

## 2016-07-27 DIAGNOSIS — Z0000 Encounter for general adult medical examination without abnormal findings: Secondary | ICD-10-CM | POA: Diagnosis not present

## 2017-01-24 DIAGNOSIS — I10 Essential (primary) hypertension: Secondary | ICD-10-CM | POA: Diagnosis not present

## 2017-01-24 DIAGNOSIS — F325 Major depressive disorder, single episode, in full remission: Secondary | ICD-10-CM | POA: Diagnosis not present

## 2017-01-24 DIAGNOSIS — G4733 Obstructive sleep apnea (adult) (pediatric): Secondary | ICD-10-CM | POA: Diagnosis not present

## 2017-01-24 DIAGNOSIS — K219 Gastro-esophageal reflux disease without esophagitis: Secondary | ICD-10-CM | POA: Diagnosis not present

## 2017-01-24 DIAGNOSIS — Z23 Encounter for immunization: Secondary | ICD-10-CM | POA: Diagnosis not present

## 2017-01-27 ENCOUNTER — Other Ambulatory Visit: Payer: Self-pay

## 2017-01-27 ENCOUNTER — Encounter (HOSPITAL_COMMUNITY): Payer: Self-pay | Admitting: Emergency Medicine

## 2017-01-27 ENCOUNTER — Ambulatory Visit (HOSPITAL_COMMUNITY)
Admission: EM | Admit: 2017-01-27 | Discharge: 2017-01-27 | Disposition: A | Discharge status: Home / Self Care | Payer: BLUE CROSS/BLUE SHIELD | Attending: Family Medicine | Admitting: Family Medicine

## 2017-01-27 DIAGNOSIS — H6123 Impacted cerumen, bilateral: Secondary | ICD-10-CM | POA: Diagnosis not present

## 2017-01-27 NOTE — ED Triage Notes (Addendum)
Pt reports his ear being clogged for the last week along with some nasal congestion.  Pt states he saw his PCP on Monday and he stated his ears were full and was supposed to have his ears cleaned but he states they were talking and they forgot about it.  Pt states he has some fluid draining from his right ear.

## 2017-01-27 NOTE — ED Provider Notes (Signed)
[facility]    CSN: [id number] Arrival date & time: 01/27/17  1837     History   Chief Complaint Chief Complaint  Patient presents with  . ears clogged    bliateral    HPI Roy Ross is a 46 y.o. male presenting with feeling like his ears are clogged. Right is worse than the left. Clogged sensation for a few days. States he usually has to get them cleaned every year. Mild congestion, occasional cough. No facial pressure.   HPI  Past Medical History:  Diagnosis Date  . Abscess    pectoralis major  . Hypertension     Patient Active Problem List   Diagnosis Date Noted  . HTN (hypertension) 07/06/2012  . Anxiety state, unspecified 07/06/2012    History reviewed. No pertinent surgical history.     Home Medications    Prior to Admission medications   Medication Sig Start Date End Date Taking? Authorizing Provider  amLODipine (NORVASC) 5 MG tablet Daily. 08/14/10  Yes [provider]  citalopram (CELEXA) 40 MG tablet Daily. 08/10/10  Yes [provider]    Family History Family History  Problem Relation Age of Onset  . Hypertension Father   . Heart disease Father        pacemaker  . Diabetes Father   . Cancer Father        bladder    Social History Social History   Tobacco Use  . Smoking status: Former Smoker    Types: Cigarettes    Last attempt to quit: 09/28/2006    Years since quitting: 10.3  . Smokeless tobacco: Never Used  Substance Use Topics  . Alcohol use: No  . Drug use: No     Allergies   Penicillins   Review of Systems Review of Systems  Constitutional: Negative for fatigue and fever.  HENT: Positive for congestion, ear discharge and ear pain. Negative for sore throat.   Eyes: Negative for discharge.  Respiratory: Positive for cough. Negative for shortness of breath.   Cardiovascular: Negative for chest pain.  Gastrointestinal: Negative for abdominal pain, nausea and vomiting.  Neurological:  Negative for dizziness, light-headedness and headaches.  All other systems reviewed and are negative.    Physical Exam Triage Vital Signs ED Triage Vitals  Enc Vitals Group     BP 01/27/17 1851 (!) 157/102     Pulse Rate 01/27/17 1851 68     Resp --      Temp 01/27/17 1851 98.4 F (36.9 C)     Temp Source 01/27/17 1851 Oral     SpO2 01/27/17 1851 95 %     Weight --      Height --      Head Circumference --      Peak Flow --      Pain Score 01/27/17 1849 3     Pain Loc --      Pain Edu? --      Excl. in GC? --    No data found.  Updated Vital Signs BP (!) 157/102 (BP Location: Left Arm)   Pulse 68   Temp 98.4 F (36.9 C) (Oral)   SpO2 95%    Physical Exam  Constitutional: He appears well-developed and well-nourished. No distress.  HENT:  Head: Normocephalic and atraumatic.  Nose: Nose normal.  Mouth/Throat: Oropharynx is clear and moist.  Right ear: external auricle with dry scaling and flaking.  yellow waxy looking cerumen blocking visualization of TM.  Left  ear: external auricle with dry scaling and flaking. Dark brown cerumen blocking visualization of TM.   After lavage/removal, Bilateral EAC clear and TM without erythema or effusion, pearly grey with good cone of light.  Cardiovascular: Normal rate and regular rhythm.  Pulmonary/Chest: Effort normal and breath sounds normal.     UC Treatments / Results  Labs (all labs ordered are listed, but only abnormal results are displayed) Labs Reviewed - No data to display  EKG  EKG Interpretation None       Radiology No results found.  Procedures Ear Cerumen Removal Date/Time: 01/27/2017 8:05 PM Performed by: Wieters, Junius CreamerHallie C, PA-C Authorized by: Elvina SidleLauenstein, Kurt, MD   Consent:    Consent obtained:  Verbal   Consent given by:  Patient   Risks discussed:  Incomplete removal and pain   Alternatives discussed:  No treatment Procedure details:    Location:  L ear and R ear   Procedure type:  irrigation   Post-procedure details:    Inspection:  TM intact   Hearing quality:  Improved   Patient tolerance of procedure:  Tolerated well, no immediate complications   (including critical care time)  Medications Ordered in UC Medications - No data to display   Initial Impression / Assessment and Plan / UC Course  I have reviewed the triage vital signs and the nursing notes.  Pertinent labs & imaging results that were available during my care of the patient were reviewed by me and considered in my medical decision making (see chart for details).     Bilateral cerumen removal performed. Advised daily allergy pill/anti-histamine for congestion/allergies.   Final Clinical Impressions(s) / UC Diagnoses   Final diagnoses:  None    ED Discharge Orders    None       Controlled Substance Prescriptions [place] Controlled Substance Registry consulted? Not Applicable   Lew DawesWieters, Hallie C, PA-C 01/27/17 [address]2004    Wieters, MeadowoodHallie C, [place] JerseyPA-C 01/27/17 2005

## 2017-01-27 NOTE — Discharge Instructions (Addendum)
We cleared your ears of the wax today.   For congestion please take a daily allergy/anti-histamine like Zyrtec, Claritin, or store-brand.   Your blood pressure was elevated today in clinic (157/102). Please be sure to take blood pressure medications as prescribed. Please monitor your blood pressure at home or when you go to a [facility]. Please follow up with your primary care doctor to recheck blood pressure and discuss any need for medication changes.   Please go to Emergency Room if you start to experience severe headache, vision changes, decreased urine production, chest pain, shortness of breath, speech slurring, one sided weakness.

## 2017-05-12 DIAGNOSIS — J309 Allergic rhinitis, unspecified: Secondary | ICD-10-CM | POA: Diagnosis not present

## 2017-05-12 DIAGNOSIS — J069 Acute upper respiratory infection, unspecified: Secondary | ICD-10-CM | POA: Diagnosis not present

## 2017-06-13 DIAGNOSIS — H6123 Impacted cerumen, bilateral: Secondary | ICD-10-CM | POA: Diagnosis not present

## 2017-06-13 DIAGNOSIS — J0100 Acute maxillary sinusitis, unspecified: Secondary | ICD-10-CM | POA: Diagnosis not present

## 2017-08-02 DIAGNOSIS — E291 Testicular hypofunction: Secondary | ICD-10-CM | POA: Diagnosis not present

## 2017-08-02 DIAGNOSIS — I10 Essential (primary) hypertension: Secondary | ICD-10-CM | POA: Diagnosis not present

## 2017-08-02 DIAGNOSIS — F325 Major depressive disorder, single episode, in full remission: Secondary | ICD-10-CM | POA: Diagnosis not present

## 2017-08-02 DIAGNOSIS — Z0000 Encounter for general adult medical examination without abnormal findings: Secondary | ICD-10-CM | POA: Diagnosis not present

## 2017-08-02 DIAGNOSIS — K219 Gastro-esophageal reflux disease without esophagitis: Secondary | ICD-10-CM | POA: Diagnosis not present

## 2017-08-02 DIAGNOSIS — Z1389 Encounter for screening for other disorder: Secondary | ICD-10-CM | POA: Diagnosis not present

## 2017-11-01 DIAGNOSIS — J0110 Acute frontal sinusitis, unspecified: Secondary | ICD-10-CM | POA: Diagnosis not present

## 2018-01-23 DIAGNOSIS — F325 Major depressive disorder, single episode, in full remission: Secondary | ICD-10-CM | POA: Diagnosis not present

## 2018-01-23 DIAGNOSIS — Z23 Encounter for immunization: Secondary | ICD-10-CM | POA: Diagnosis not present

## 2018-01-23 DIAGNOSIS — G4733 Obstructive sleep apnea (adult) (pediatric): Secondary | ICD-10-CM | POA: Diagnosis not present

## 2018-01-23 DIAGNOSIS — I10 Essential (primary) hypertension: Secondary | ICD-10-CM | POA: Diagnosis not present

## 2018-01-23 DIAGNOSIS — R7309 Other abnormal glucose: Secondary | ICD-10-CM | POA: Diagnosis not present

## 2018-08-07 DIAGNOSIS — G4733 Obstructive sleep apnea (adult) (pediatric): Secondary | ICD-10-CM | POA: Diagnosis not present

## 2018-08-07 DIAGNOSIS — I10 Essential (primary) hypertension: Secondary | ICD-10-CM | POA: Diagnosis not present

## 2018-08-07 DIAGNOSIS — K219 Gastro-esophageal reflux disease without esophagitis: Secondary | ICD-10-CM | POA: Diagnosis not present

## 2018-08-07 DIAGNOSIS — Z0000 Encounter for general adult medical examination without abnormal findings: Secondary | ICD-10-CM | POA: Diagnosis not present

## 2018-08-07 DIAGNOSIS — R7309 Other abnormal glucose: Secondary | ICD-10-CM | POA: Diagnosis not present

## 2018-08-07 DIAGNOSIS — Z125 Encounter for screening for malignant neoplasm of prostate: Secondary | ICD-10-CM | POA: Diagnosis not present

## 2018-08-07 DIAGNOSIS — E291 Testicular hypofunction: Secondary | ICD-10-CM | POA: Diagnosis not present

## 2019-01-08 ENCOUNTER — Other Ambulatory Visit: Payer: Self-pay

## 2019-01-08 DIAGNOSIS — Z20822 Contact with and (suspected) exposure to covid-19: Secondary | ICD-10-CM

## 2019-01-10 LAB — NOVEL CORONAVIRUS, NAA: SARS-CoV-2, NAA: NOT DETECTED

## 2019-01-22 DIAGNOSIS — E291 Testicular hypofunction: Secondary | ICD-10-CM | POA: Diagnosis not present

## 2019-01-22 DIAGNOSIS — F325 Major depressive disorder, single episode, in full remission: Secondary | ICD-10-CM | POA: Diagnosis not present

## 2019-01-22 DIAGNOSIS — G4733 Obstructive sleep apnea (adult) (pediatric): Secondary | ICD-10-CM | POA: Diagnosis not present

## 2019-01-22 DIAGNOSIS — I10 Essential (primary) hypertension: Secondary | ICD-10-CM | POA: Diagnosis not present

## 2019-07-13 DIAGNOSIS — H6121 Impacted cerumen, right ear: Secondary | ICD-10-CM | POA: Diagnosis not present

## 2019-07-25 DIAGNOSIS — I10 Essential (primary) hypertension: Secondary | ICD-10-CM | POA: Diagnosis not present

## 2019-07-25 DIAGNOSIS — E291 Testicular hypofunction: Secondary | ICD-10-CM | POA: Diagnosis not present

## 2019-07-25 DIAGNOSIS — Z125 Encounter for screening for malignant neoplasm of prostate: Secondary | ICD-10-CM | POA: Diagnosis not present

## 2019-07-25 DIAGNOSIS — Z13220 Encounter for screening for lipoid disorders: Secondary | ICD-10-CM | POA: Diagnosis not present

## 2019-07-25 DIAGNOSIS — K219 Gastro-esophageal reflux disease without esophagitis: Secondary | ICD-10-CM | POA: Diagnosis not present

## 2019-07-25 DIAGNOSIS — G4733 Obstructive sleep apnea (adult) (pediatric): Secondary | ICD-10-CM | POA: Diagnosis not present

## 2019-07-25 DIAGNOSIS — Z23 Encounter for immunization: Secondary | ICD-10-CM | POA: Diagnosis not present

## 2019-07-25 DIAGNOSIS — Z0000 Encounter for general adult medical examination without abnormal findings: Secondary | ICD-10-CM | POA: Diagnosis not present

## 2019-08-17 DIAGNOSIS — D229 Melanocytic nevi, unspecified: Secondary | ICD-10-CM | POA: Diagnosis not present

## 2019-08-17 DIAGNOSIS — D2262 Melanocytic nevi of left upper limb, including shoulder: Secondary | ICD-10-CM | POA: Diagnosis not present

## 2019-08-17 DIAGNOSIS — D485 Neoplasm of uncertain behavior of skin: Secondary | ICD-10-CM | POA: Diagnosis not present

## 2019-09-17 DIAGNOSIS — I10 Essential (primary) hypertension: Secondary | ICD-10-CM | POA: Diagnosis not present

## 2019-09-19 DIAGNOSIS — I10 Essential (primary) hypertension: Secondary | ICD-10-CM | POA: Diagnosis not present

## 2019-10-05 DIAGNOSIS — D2271 Melanocytic nevi of right lower limb, including hip: Secondary | ICD-10-CM | POA: Diagnosis not present

## 2019-10-05 DIAGNOSIS — D485 Neoplasm of uncertain behavior of skin: Secondary | ICD-10-CM | POA: Diagnosis not present

## 2019-10-05 DIAGNOSIS — D2262 Melanocytic nevi of left upper limb, including shoulder: Secondary | ICD-10-CM | POA: Diagnosis not present

## 2019-10-08 DIAGNOSIS — I10 Essential (primary) hypertension: Secondary | ICD-10-CM | POA: Diagnosis not present

## 2020-01-21 DIAGNOSIS — I10 Essential (primary) hypertension: Secondary | ICD-10-CM | POA: Diagnosis not present

## 2020-01-21 DIAGNOSIS — F331 Major depressive disorder, recurrent, moderate: Secondary | ICD-10-CM | POA: Diagnosis not present

## 2020-01-21 DIAGNOSIS — G4733 Obstructive sleep apnea (adult) (pediatric): Secondary | ICD-10-CM | POA: Diagnosis not present

## 2020-01-21 DIAGNOSIS — R197 Diarrhea, unspecified: Secondary | ICD-10-CM | POA: Diagnosis not present

## 2020-05-07 DIAGNOSIS — R109 Unspecified abdominal pain: Secondary | ICD-10-CM | POA: Diagnosis not present

## 2020-05-07 DIAGNOSIS — K219 Gastro-esophageal reflux disease without esophagitis: Secondary | ICD-10-CM | POA: Diagnosis not present

## 2020-05-07 DIAGNOSIS — R195 Other fecal abnormalities: Secondary | ICD-10-CM | POA: Diagnosis not present

## 2020-05-07 DIAGNOSIS — R1013 Epigastric pain: Secondary | ICD-10-CM | POA: Diagnosis not present

## 2020-05-07 DIAGNOSIS — K625 Hemorrhage of anus and rectum: Secondary | ICD-10-CM | POA: Diagnosis not present

## 2020-08-01 DIAGNOSIS — K2210 Ulcer of esophagus without bleeding: Secondary | ICD-10-CM | POA: Diagnosis not present

## 2020-08-01 DIAGNOSIS — K5289 Other specified noninfective gastroenteritis and colitis: Secondary | ICD-10-CM | POA: Diagnosis not present

## 2020-08-01 DIAGNOSIS — K269 Duodenal ulcer, unspecified as acute or chronic, without hemorrhage or perforation: Secondary | ICD-10-CM | POA: Diagnosis not present

## 2020-08-01 DIAGNOSIS — K219 Gastro-esophageal reflux disease without esophagitis: Secondary | ICD-10-CM | POA: Diagnosis not present

## 2020-08-01 DIAGNOSIS — K2981 Duodenitis with bleeding: Secondary | ICD-10-CM | POA: Diagnosis not present

## 2020-08-01 DIAGNOSIS — K633 Ulcer of intestine: Secondary | ICD-10-CM | POA: Diagnosis not present

## 2020-08-01 DIAGNOSIS — K529 Noninfective gastroenteritis and colitis, unspecified: Secondary | ICD-10-CM | POA: Diagnosis not present

## 2020-08-01 DIAGNOSIS — Z8371 Family history of colonic polyps: Secondary | ICD-10-CM | POA: Diagnosis not present

## 2020-08-01 DIAGNOSIS — R1013 Epigastric pain: Secondary | ICD-10-CM | POA: Diagnosis not present

## 2020-08-01 DIAGNOSIS — K2930 Chronic superficial gastritis without bleeding: Secondary | ICD-10-CM | POA: Diagnosis not present

## 2020-08-01 DIAGNOSIS — Z1211 Encounter for screening for malignant neoplasm of colon: Secondary | ICD-10-CM | POA: Diagnosis not present

## 2020-08-01 DIAGNOSIS — K5730 Diverticulosis of large intestine without perforation or abscess without bleeding: Secondary | ICD-10-CM | POA: Diagnosis not present

## 2020-08-01 DIAGNOSIS — K317 Polyp of stomach and duodenum: Secondary | ICD-10-CM | POA: Diagnosis not present

## 2020-09-02 DIAGNOSIS — K2210 Ulcer of esophagus without bleeding: Secondary | ICD-10-CM | POA: Diagnosis not present

## 2020-09-02 DIAGNOSIS — K50011 Crohn's disease of small intestine with rectal bleeding: Secondary | ICD-10-CM | POA: Diagnosis not present

## 2020-09-08 DIAGNOSIS — J00 Acute nasopharyngitis [common cold]: Secondary | ICD-10-CM | POA: Diagnosis not present

## 2020-09-08 DIAGNOSIS — Z03818 Encounter for observation for suspected exposure to other biological agents ruled out: Secondary | ICD-10-CM | POA: Diagnosis not present

## 2020-09-08 DIAGNOSIS — I10 Essential (primary) hypertension: Secondary | ICD-10-CM | POA: Diagnosis not present

## 2020-09-08 DIAGNOSIS — Z20822 Contact with and (suspected) exposure to covid-19: Secondary | ICD-10-CM | POA: Diagnosis not present

## 2020-11-14 ENCOUNTER — Other Ambulatory Visit: Payer: Self-pay | Admitting: Internal Medicine

## 2020-11-14 DIAGNOSIS — R350 Frequency of micturition: Secondary | ICD-10-CM | POA: Diagnosis not present

## 2020-11-14 DIAGNOSIS — R479 Unspecified speech disturbances: Secondary | ICD-10-CM | POA: Diagnosis not present

## 2020-11-14 DIAGNOSIS — R4789 Other speech disturbances: Secondary | ICD-10-CM | POA: Diagnosis not present

## 2020-11-17 DIAGNOSIS — K50011 Crohn's disease of small intestine with rectal bleeding: Secondary | ICD-10-CM | POA: Diagnosis not present

## 2020-11-17 DIAGNOSIS — K2210 Ulcer of esophagus without bleeding: Secondary | ICD-10-CM | POA: Diagnosis not present

## 2020-11-19 ENCOUNTER — Ambulatory Visit
Admission: RE | Admit: 2020-11-19 | Discharge: 2020-11-19 | Disposition: A | Discharge status: Home / Self Care | Payer: Self-pay | Source: Ambulatory Visit | Attending: Internal Medicine | Admitting: Internal Medicine

## 2020-11-19 ENCOUNTER — Other Ambulatory Visit: Payer: Self-pay

## 2020-11-19 DIAGNOSIS — R4789 Other speech disturbances: Secondary | ICD-10-CM

## 2020-11-19 DIAGNOSIS — R479 Unspecified speech disturbances: Secondary | ICD-10-CM | POA: Diagnosis not present

## 2020-11-19 MED ORDER — GADOBENATE DIMEGLUMINE 529 MG/ML IV SOLN
19.0000 mL | Freq: Once | INTRAVENOUS | Status: AC | PRN
  Administered 2020-11-19: 19 mL via INTRAVENOUS

## 2020-11-20 ENCOUNTER — Encounter: Payer: Self-pay | Admitting: Neurology

## 2020-11-20 ENCOUNTER — Ambulatory Visit: Payer: BC Managed Care – PPO | Admitting: Neurology

## 2020-11-20 VITALS — BP 133/82 | HR 60 | Ht 67.0 in | Wt 200.0 lb

## 2020-11-20 DIAGNOSIS — G459 Transient cerebral ischemic attack, unspecified: Secondary | ICD-10-CM | POA: Diagnosis not present

## 2020-11-20 DIAGNOSIS — R4781 Slurred speech: Secondary | ICD-10-CM

## 2020-11-20 NOTE — Progress Notes (Signed)
[facility]  PATIENT: Roy Ross DOB: 1970-04-27  REFERRING CLINICIAN: Georgann Housekeeper, MD HISTORY FROM: Patient  REASON FOR VISIT: Slurred speech    HISTORICAL  CHIEF COMPLAINT:  Chief Complaint  Patient presents with   New Patient (Initial Visit)    Her to discuss recent episodes of sz or stroke like activity, reports difficulty saying the right words, would like to discuss MRI results     HISTORY OF PRESENT ILLNESS:  This is a 50 year old gentleman with past medical history of hypertension and depression who is coming in with complaint of recurrent episodes of slurred speech.  Patient said the first episode happened on September 20, while he was at work he had difficulty pronouncing the right word, he was concerned at that time but did not seek any immediate help.  In October 4th, he had another similar episode and since October 4 he has been having more frequent episodes of slurred speech.  Episodes usually last between 15 to 20-minutes after which he is back to his normal self, no associated symptoms, no numbness, no tingling, no headache, no change in vision and no other complaints.  He presented to his primary care doctor who referred him here for TIA work-up.   Patient also complained about a previous episode in early August of this year while he was picking up objects at work, he noted 1 episode of urinary incontinence.  He stated at this time he did not have slurred speech   OTHER MEDICAL CONDITIONS: HTN, Depression    REVIEW OF SYSTEMS: Full 14 system review of systems performed and negative with exception of: as noted in the HPI  ALLERGIES: Allergies  Allergen Reactions   Penicillins     Pt was told as child    HOME MEDICATIONS: Outpatient Medications Prior to Visit  Medication Sig Dispense Refill   amLODipine (NORVASC) 5 MG tablet Daily.     citalopram (CELEXA) 40 MG tablet 1 tablet     losartan-hydrochlorothiazide (HYZAAR) 50-12.5  MG tablet Take 1 tablet by mouth every morning.     Mesalamine 800 MG TBEC Take 2 tablets by mouth 3 (three) times daily.     pantoprazole (PROTONIX) 40 MG tablet Take 40 mg by mouth 2 (two) times daily.     citalopram (CELEXA) 40 MG tablet Daily.     No facility-administered medications prior to visit.    PAST MEDICAL HISTORY: Past Medical History:  Diagnosis Date   Abscess    pectoralis major   Hypertension     PAST SURGICAL HISTORY: History reviewed. No pertinent surgical history.  FAMILY HISTORY: Family History  Problem Relation Age of Onset   Hypertension Father    Heart disease Father        pacemaker   Diabetes Father    Cancer Father        bladder    SOCIAL HISTORY: Social History   Socioeconomic History   Marital status: Single    Spouse name: Not on file   Number of children: Not on file   Years of education: Not on file   Highest education level: Not on file  Occupational History   Not on file  Tobacco Use   Smoking status: Former    Types: Cigarettes    Quit date: 09/28/2006    Years since quitting: 14.1   Smokeless tobacco: Never  Substance and Sexual Activity   Alcohol use: No   Drug use: No   Sexual activity: Not Currently  Other Topics Concern   Not on file  Social History Narrative   Not on file   Social Determinants of Health   Financial Resource Strain: Not on file  Food Insecurity: Not on file  Transportation Needs: Not on file  Physical Activity: Not on file  Stress: Not on file  Social Connections: Not on file  Intimate Partner Violence: Not on file     PHYSICAL EXAM  GENERAL EXAM/CONSTITUTIONAL: Vitals:  Vitals:   11/20/20 1508  BP: 133/82  Pulse: 60  Weight: 200 lb (90.7 kg)  Height: 5\' 7"  (1.702 m)   Body mass index is 31.32 kg/m. Wt Readings from Last 3 Encounters:  11/20/20 200 lb (90.7 kg)  06/12/15 209 lb (94.8 kg)  03/15/14 217 lb 12.8 oz (98.8 kg)   Patient is in no distress; well developed, nourished  and groomed; neck is supple  EYES: Pupils round and reactive to light, Visual fields full to confrontation, Extraocular movements intacts,   MUSCULOSKELETAL: Gait, strength, tone, movements noted in Neurologic exam below  NEUROLOGIC: MENTAL STATUS:  No flowsheet data found. awake, alert, oriented to person, place and time recent and remote memory intact normal attention and concentration language fluent, comprehension intact, naming intact, no slurred speech noted. fund of knowledge appropriate  CRANIAL NERVE:  2nd, 3rd, 4th, 6th - pupils equal and reactive to light, visual fields full to confrontation, extraocular muscles intact, no nystagmus 5th - facial sensation symmetric 7th - facial strength symmetric 8th - hearing intact 9th - palate elevates symmetrically, uvula midline 11th - shoulder shrug symmetric 12th - tongue protrusion midline  MOTOR:  normal bulk and tone, full strength in the BUE, BLE  SENSORY:  normal and symmetric to light touch, pinprick, temperature, vibration  COORDINATION:  finger-nose-finger, fine finger movements normal  REFLEXES:  deep tendon reflexes present and symmetric  GAIT/STATION:  normal    DIAGNOSTIC DATA (LABS, IMAGING, TESTING) - I reviewed patient records, labs, notes, testing and imaging myself where available.  No results found for: WBC, HGB, HCT, MCV, PLT No results found for: NA, K, CL, CO2, GLUCOSE, BUN, CREATININE, CALCIUM, PROT, ALBUMIN, AST, ALT, ALKPHOS, BILITOT, GFRNONAA, GFRAA No results found for: CHOL, HDL, LDLCALC, LDLDIRECT, TRIG, CHOLHDL No results found for: 05/14/14 No results found for: VITAMINB12 No results found for: TSH   MRI Brain 11/20/2020 Unremarkable MRI of the brain.   ASSESSMENT AND PLAN  50 y.o. year old male with past medical history of hypertension and depression who is presenting with recurrent episodes of slurred speech lasting 15 to 20-minutes.  Patient said that at that time he is aware  of what he is trying to say but the words come out different.  He denies any aphasia, denies any paraphasic errors and no associated symptoms.  He had a brain MRI which was unremarkable.  I will complete the TIA work-up and obtain a lipid panel, hemoglobin A1c, CTA head and neck and echocardiogram.  Because of this symptom of urinary incontinence I will also add a routine EEG to further background assessment. I will contact the patient to go over the results otherwise I will see him in 48-months for follow-up.   1. TIA (transient ischemic attack)   2. Slurred speech     PLAN: Start with aspirin 81 mg daily  Will obtain CTA head and neck, echocardiogram, Lipid panel and HgA1C  Will also obtain a routine EEG  Will return in 3 months   Orders Placed This Encounter  Procedures  CT ANGIO HEAD W OR WO CONTRAST   CT ANGIO NECK W OR WO CONTRAST   Lipid Panel   Hemoglobin A1c   ECHOCARDIOGRAM COMPLETE   EEG adult     No orders of the defined types were placed in this encounter.   Return in about 3 months (around 02/20/2021).    Windell Norfolk, MD 11/20/2020, 3:48 PM  [facility] [address] [place], [place] [address] [phone]

## 2020-11-20 NOTE — Patient Instructions (Addendum)
Start with aspirin 81 mg daily  Will obtain CTA head and neck, echocardiogram, Lipid panel and HgA1C  Will also obtain a routine EEG  Will return in 3 months

## 2020-11-21 ENCOUNTER — Telehealth: Payer: Self-pay | Admitting: Neurology

## 2020-11-21 LAB — HEMOGLOBIN A1C
Est. average glucose Bld gHb Est-mCnc: 111 mg/dL
Hgb A1c MFr Bld: 5.5 % (ref 4.8–5.6)

## 2020-11-21 LAB — LIPID PANEL
Chol/HDL Ratio: 6.4 ratio — ABNORMAL HIGH (ref 0.0–5.0)
Cholesterol, Total: 166 mg/dL (ref 100–199)
HDL: 26 mg/dL — ABNORMAL LOW (ref >39)
LDL Chol Calc (NIH): 101 mg/dL — ABNORMAL HIGH (ref 0–99)
Triglycerides: 225 mg/dL — ABNORMAL HIGH (ref 0–149)
VLDL Cholesterol Cal: 39 mg/dL (ref 5–40)

## 2020-11-21 MED ORDER — ATORVASTATIN CALCIUM 40 MG PO TABS: 40.0000 mg | ORAL_TABLET | Freq: Every evening | ORAL | 3 refills | Status: DC

## 2020-11-21 MED ORDER — ATORVASTATIN CALCIUM 80 MG PO TABS: 80.0000 mg | ORAL_TABLET | Freq: Every evening | ORAL | 3 refills | Status: DC

## 2020-11-21 NOTE — Addendum Note (Signed)
Addended byWindell Norfolk on: 11/21/2020 09:36 AM   Modules accepted: Orders

## 2020-11-21 NOTE — Telephone Encounter (Signed)
Called patient to discuss lab results, his hemoglobin A1c was normal at 5.5 but his lipid panel showed a triglyceride of 225, HDL of 226 and LDL of 101.  I will start the patient on atorvastatin.  I will recheck the lipid panel within 3 to 6 months.  Roy Norfolk, MD

## 2020-11-27 ENCOUNTER — Other Ambulatory Visit: Payer: BC Managed Care – PPO | Admitting: *Deleted

## 2020-12-01 ENCOUNTER — Telehealth: Payer: Self-pay | Admitting: Neurology

## 2020-12-01 NOTE — Telephone Encounter (Signed)
no auth require per [employer] aim, sent message to Roy Ross to reach out to the patient to schedule.

## 2020-12-04 ENCOUNTER — Other Ambulatory Visit: Payer: BC Managed Care – PPO | Admitting: *Deleted

## 2020-12-15 ENCOUNTER — Ambulatory Visit: Payer: BC Managed Care – PPO | Admitting: Neurology

## 2020-12-15 DIAGNOSIS — R4701 Aphasia: Secondary | ICD-10-CM | POA: Diagnosis not present

## 2020-12-15 DIAGNOSIS — R4781 Slurred speech: Secondary | ICD-10-CM

## 2020-12-16 NOTE — Procedures (Signed)
    History:  61 year man with episode of slurred speech and urinary incontinence  EEG classification: Awake and drowsy  Description of the recording: The background rhythms of this recording consists of a fairly well modulated medium amplitude alpha rhythm of 8-9 Hz that is reactive to eye opening and closure. As the record progresses, the patient appears to remain in the waking state throughout the recording. Photic stimulation was performed, did not show any abnormalities. Hyperventilation was not performed. Toward the end of the recording, the patient enters the drowsy state with slight symmetric slowing seen. The patient never enters stage II sleep. No abnormal epileptiform discharges seen during this recording. There was no focal slowing. EKG monitor shows no evidence of cardiac rhythm abnormalities with a heart rate of 66.  Impression: This is a normal EEG recording in the waking and drowsy state. No evidence of interictal epileptiform discharges seen. A normal EEG does not exclude a diagnosis of epilepsy.    Windell Norfolk, MD [facility]

## 2021-01-12 ENCOUNTER — Other Ambulatory Visit: Payer: Self-pay

## 2021-01-12 ENCOUNTER — Ambulatory Visit (HOSPITAL_COMMUNITY)
Admission: RE | Admit: 2021-01-12 | Discharge: 2021-01-12 | Disposition: A | Discharge status: Home / Self Care | Payer: BC Managed Care – PPO | Source: Ambulatory Visit | Attending: Neurology | Admitting: Neurology

## 2021-01-12 DIAGNOSIS — I10 Essential (primary) hypertension: Secondary | ICD-10-CM | POA: Diagnosis not present

## 2021-01-12 DIAGNOSIS — G459 Transient cerebral ischemic attack, unspecified: Secondary | ICD-10-CM | POA: Diagnosis not present

## 2021-01-14 DIAGNOSIS — Z13220 Encounter for screening for lipoid disorders: Secondary | ICD-10-CM | POA: Diagnosis not present

## 2021-01-14 DIAGNOSIS — Z0000 Encounter for general adult medical examination without abnormal findings: Secondary | ICD-10-CM | POA: Diagnosis not present

## 2021-01-14 DIAGNOSIS — Z23 Encounter for immunization: Secondary | ICD-10-CM | POA: Diagnosis not present

## 2021-01-14 DIAGNOSIS — Z125 Encounter for screening for malignant neoplasm of prostate: Secondary | ICD-10-CM | POA: Diagnosis not present

## 2021-01-14 DIAGNOSIS — I10 Essential (primary) hypertension: Secondary | ICD-10-CM | POA: Diagnosis not present

## 2021-01-14 DIAGNOSIS — K5090 Crohn's disease, unspecified, without complications: Secondary | ICD-10-CM | POA: Diagnosis not present

## 2021-01-14 DIAGNOSIS — E291 Testicular hypofunction: Secondary | ICD-10-CM | POA: Diagnosis not present

## 2021-01-16 LAB — ECHOCARDIOGRAM COMPLETE
Area-P 1/2: 3.48 cm2
S' Lateral: 3.15 cm

## 2021-02-24 ENCOUNTER — Ambulatory Visit: Payer: BC Managed Care – PPO | Admitting: Neurology

## 2021-03-17 ENCOUNTER — Other Ambulatory Visit: Payer: Self-pay

## 2021-03-17 ENCOUNTER — Encounter: Payer: Self-pay | Admitting: Neurology

## 2021-03-17 ENCOUNTER — Ambulatory Visit: Payer: BC Managed Care – PPO | Admitting: Neurology

## 2021-03-17 VITALS — BP 128/80 | HR 76 | Ht 67.0 in | Wt 200.5 lb

## 2021-03-17 DIAGNOSIS — F17229 Nicotine dependence, chewing tobacco, with unspecified nicotine-induced disorders: Secondary | ICD-10-CM

## 2021-03-17 DIAGNOSIS — R4781 Slurred speech: Secondary | ICD-10-CM

## 2021-03-17 DIAGNOSIS — G459 Transient cerebral ischemic attack, unspecified: Secondary | ICD-10-CM | POA: Diagnosis not present

## 2021-03-17 NOTE — Progress Notes (Signed)
[facility]  PATIENT: Roy Ross DOB: 1970/11/05  REFERRING CLINICIAN: Georgann Housekeeper, MD HISTORY FROM: Patient  REASON FOR VISIT: Slurred speech    HISTORICAL  CHIEF COMPLAINT:  Chief Complaint  Patient presents with   Follow-up    Rm 13. Alone. PCP is Dr. Georgann Housekeeper. Pt c/o continued speech issues, accompanied by difficulty eating and swallowing. Not aware of any stroke like activity.      INTERVAL HISTORY 03/17/2021:  Patient presents today for follow-up, at last visit plan was to do the stroke/TIA work-up.  He did have a stroke labs hemoglobin A1c was within normal limits and lipid panel was slightly elevated, I started him on Lipitor 40 mg daily.  His routine EEG was normal, no seizures and no seizures and no epileptiform discharges.  Prior to he did not complete his CT angiogram head and neck.  Since last visit he continued to report episode of slurred speech, sometime he does have difficulty swallowing when he had the slurred speech.  Denies any pain in the tongue, denies sensory change on tongue, denies any choking episode.  He did follow-up with his primary care doctor, so far he has not found any etiology.  Patient also reported that he has a history of chewing tobacco for more than 30 years.  Currently his stress level is high but symptoms started prior to increase in stress.  No other complaints   HISTORY OF PRESENT ILLNESS:  This is a 51 year old gentleman with past medical history of hypertension and depression who is coming in with complaint of recurrent episodes of slurred speech.  Patient said the first episode happened on September 20, while he was at work he had difficulty pronouncing the right word, he was concerned at that time but did not seek any immediate help.  In October 4th, he had another similar episode and since October 4 he has been having more frequent episodes of slurred speech.  Episodes usually last between 15 to  20-minutes after which he is back to his normal self, no associated symptoms, no numbness, no tingling, no headache, no change in vision and no other complaints.  He presented to his primary care doctor who referred him here for TIA work-up.   Patient also complained about a previous episode in early August of this year while he was picking up objects at work, he noted 1 episode of urinary incontinence.  He stated at this time he did not have slurred speech   OTHER MEDICAL CONDITIONS: HTN, Depression    REVIEW OF SYSTEMS: Full 14 system review of systems performed and negative with exception of: as noted in the HPI  ALLERGIES: Allergies  Allergen Reactions   Penicillins     Pt was told as child    HOME MEDICATIONS: Outpatient Medications Prior to Visit  Medication Sig Dispense Refill   amLODipine (NORVASC) 5 MG tablet Daily.     citalopram (CELEXA) 40 MG tablet 1 tablet     losartan-hydrochlorothiazide (HYZAAR) 50-12.5 MG tablet Take 1 tablet by mouth every morning.     Mesalamine 800 MG TBEC Take 2 tablets by mouth 3 (three) times daily.     pantoprazole (PROTONIX) 40 MG tablet Take 40 mg by mouth 2 (two) times daily.     atorvastatin (LIPITOR) 40 MG tablet Take 1 tablet (40 mg total) by mouth at bedtime. 90 tablet 3   No facility-administered medications prior to visit.    PAST MEDICAL HISTORY: Past Medical History:  Diagnosis  Date   Abscess    pectoralis major   Hypertension     PAST SURGICAL HISTORY: History reviewed. No pertinent surgical history.  FAMILY HISTORY: Family History  Problem Relation Age of Onset   Hypertension Father    Heart disease Father        pacemaker   Diabetes Father    Cancer Father        bladder    SOCIAL HISTORY: Social History   Socioeconomic History   Marital status: Single    Spouse name: Not on file   Number of children: Not on file   Years of education: Not on file   Highest education level: Not on file  Occupational  History   Not on file  Tobacco Use   Smoking status: Former    Types: Cigarettes    Quit date: 09/28/2006    Years since quitting: 14.4   Smokeless tobacco: Never  Substance and Sexual Activity   Alcohol use: No   Drug use: No   Sexual activity: Not Currently  Other Topics Concern   Not on file  Social History Narrative   Not on file   Social Determinants of Health   Financial Resource Strain: Not on file  Food Insecurity: Not on file  Transportation Needs: Not on file  Physical Activity: Not on file  Stress: Not on file  Social Connections: Not on file  Intimate Partner Violence: Not on file     PHYSICAL EXAM  GENERAL EXAM/CONSTITUTIONAL: Vitals:  Vitals:   03/17/21 1452  BP: 128/80  Pulse: 76  Weight: 200 lb 8 oz (90.9 kg)  Height: 5\' 7"  (1.702 m)   Body mass index is 31.4 kg/m. Wt Readings from Last 3 Encounters:  03/17/21 200 lb 8 oz (90.9 kg)  11/20/20 200 lb (90.7 kg)  06/12/15 209 lb (94.8 kg)   Patient is in no distress; well developed, nourished and groomed; neck is supple  EYES: Pupils round and reactive to light, Visual fields full to confrontation, Extraocular movements intacts,   MUSCULOSKELETAL: Gait, strength, tone, movements noted in Neurologic exam below  NEUROLOGIC: MENTAL STATUS:  No flowsheet data found. awake, alert, oriented to person, place and time recent and remote memory intact normal attention and concentration language fluent, comprehension intact, naming intact, no slurred speech noted. fund of knowledge appropriate  CRANIAL NERVE:  2nd, 3rd, 4th, 6th - pupils equal and reactive to light, visual fields full to confrontation, extraocular muscles intact, no nystagmus 5th - facial sensation symmetric 7th - facial strength symmetric 8th - hearing intact 9th - palate elevates symmetrically, uvula midline 11th - shoulder shrug symmetric 12th - tongue protrusion midline  MOTOR:  normal bulk and tone, full strength in the  BUE, BLE  SENSORY:  normal and symmetric to light touch, pinprick, temperature, vibration  COORDINATION:  finger-nose-finger, fine finger movements normal  REFLEXES:  deep tendon reflexes present and symmetric  GAIT/STATION:  normal    DIAGNOSTIC DATA (LABS, IMAGING, TESTING) - I reviewed patient records, labs, notes, testing and imaging myself where available.  No results found for: WBC, HGB, HCT, MCV, PLT No results found for: NA, K, CL, CO2, GLUCOSE, BUN, CREATININE, CALCIUM, PROT, ALBUMIN, AST, ALT, ALKPHOS, BILITOT, GFRNONAA, GFRAA Lab Results  Component Value Date   CHOL 166 11/20/2020   HDL 26 (L) 11/20/2020   LDLCALC 101 (H) 11/20/2020   TRIG 225 (H) 11/20/2020   CHOLHDL 6.4 (H) 11/20/2020   Lab Results  Component Value Date  HGBA1C 5.5 11/20/2020   No results found for: VITAMINB12 No results found for: TSH   MRI Brain 11/20/2020 Unremarkable MRI of the brain.   Routine EEG 12/15/2021 This is a normal EEG recording in the waking and drowsy state. No evidence of interictal epileptiform discharges seen. A normal EEG does not exclude a diagnosis of epilepsy    ASSESSMENT AND PLAN  51 y.o. year old male with past medical history of hypertension and depression who is presenting with recurrent episodes of slurred speech lasting 15 to 20-minutes who is presenting for follow up.  Since last visit I completed the stroke labs, his A1c is 5.5, his lipid profile is slightly elevated, started him on Lipitor 40 mg daily.  He did have a routine EEG which she is normal, no seizures may have been from discharge.  Currently patient episode of slurred speech are not due to a stroke, brain trauma or brain tumor.  He does report a history of chewing tobacco for more than 30 years, inform patient that his symptoms may be related to his history of chewing tobacco.  I recommended him to follow-up with Dr. Eula Listen, and to start a tobacco cessation plan.  Initially he can try nicotine  gum, Nicotine patch.  He voices understanding and is comfortable with plan.  I will check a vitamin B12 level and a TSH.  I will contact the patient to go over the result.  Return if worse.   1. Slurred speech   2. Chewing tobacco nicotine dependence with nicotine-induced disorder      Patient Instructions  Follow up with Dr Donette Larry  Will recommend chewing tobacco cessation  Return as needed     PLAN: Start with aspirin 81 mg daily  Will obtain CTA head and neck, echocardiogram, Lipid panel and HgA1C  Will also obtain a routine EEG  Will return in 3 months   Orders Placed This Encounter  Procedures   TSH   Vitamin B12     No orders of the defined types were placed in this encounter.    Return if symptoms worsen or fail to improve.    Windell Norfolk, MD 03/17/2021, 3:36 PM  [facility] [address] [place], [place] [address] [phone]

## 2021-03-17 NOTE — Patient Instructions (Addendum)
Follow up with Dr Donette Larry  Will recommend chewing tobacco cessation  Return as needed

## 2021-03-18 LAB — TSH: TSH: 2.84 u[IU]/mL (ref 0.450–4.500)

## 2021-03-18 LAB — VITAMIN B12: Vitamin B-12: 474 pg/mL (ref 232–1245)

## 2021-05-12 DIAGNOSIS — K148 Other diseases of tongue: Secondary | ICD-10-CM | POA: Diagnosis not present

## 2021-05-12 DIAGNOSIS — R471 Dysarthria and anarthria: Secondary | ICD-10-CM | POA: Diagnosis not present

## 2021-05-12 DIAGNOSIS — R1313 Dysphagia, pharyngeal phase: Secondary | ICD-10-CM | POA: Diagnosis not present

## 2021-05-12 DIAGNOSIS — J343 Hypertrophy of nasal turbinates: Secondary | ICD-10-CM | POA: Diagnosis not present

## 2021-07-09 DIAGNOSIS — R1313 Dysphagia, pharyngeal phase: Secondary | ICD-10-CM | POA: Diagnosis not present

## 2021-07-09 DIAGNOSIS — R1310 Dysphagia, unspecified: Secondary | ICD-10-CM | POA: Diagnosis not present

## 2021-07-09 DIAGNOSIS — I10 Essential (primary) hypertension: Secondary | ICD-10-CM | POA: Diagnosis not present

## 2021-07-09 DIAGNOSIS — R471 Dysarthria and anarthria: Secondary | ICD-10-CM | POA: Diagnosis not present

## 2021-07-24 DIAGNOSIS — G7000 Myasthenia gravis without (acute) exacerbation: Secondary | ICD-10-CM | POA: Diagnosis not present

## 2021-08-14 ENCOUNTER — Inpatient Hospital Stay (HOSPITAL_BASED_OUTPATIENT_CLINIC_OR_DEPARTMENT_OTHER)
Admission: EM | Admit: 2021-08-14 | Discharge: 2021-08-17 | DRG: 392 | Disposition: A | Discharge status: Home / Self Care | Payer: BC Managed Care – PPO | Attending: Internal Medicine | Admitting: Internal Medicine

## 2021-08-14 ENCOUNTER — Other Ambulatory Visit: Payer: Self-pay

## 2021-08-14 ENCOUNTER — Ambulatory Visit
Admission: RE | Admit: 2021-08-14 | Discharge: 2021-08-14 | Disposition: A | Discharge status: Home / Self Care | Payer: BC Managed Care – PPO | Source: Ambulatory Visit | Attending: Physician Assistant | Admitting: Physician Assistant

## 2021-08-14 ENCOUNTER — Encounter (HOSPITAL_BASED_OUTPATIENT_CLINIC_OR_DEPARTMENT_OTHER): Payer: Self-pay

## 2021-08-14 ENCOUNTER — Other Ambulatory Visit: Payer: Self-pay | Admitting: Physician Assistant

## 2021-08-14 DIAGNOSIS — Z8249 Family history of ischemic heart disease and other diseases of the circulatory system: Secondary | ICD-10-CM | POA: Diagnosis not present

## 2021-08-14 DIAGNOSIS — R6883 Chills (without fever): Secondary | ICD-10-CM

## 2021-08-14 DIAGNOSIS — G7000 Myasthenia gravis without (acute) exacerbation: Secondary | ICD-10-CM | POA: Diagnosis present

## 2021-08-14 DIAGNOSIS — Z87891 Personal history of nicotine dependence: Secondary | ICD-10-CM

## 2021-08-14 DIAGNOSIS — K219 Gastro-esophageal reflux disease without esophagitis: Secondary | ICD-10-CM | POA: Diagnosis not present

## 2021-08-14 DIAGNOSIS — Z8052 Family history of malignant neoplasm of bladder: Secondary | ICD-10-CM | POA: Diagnosis not present

## 2021-08-14 DIAGNOSIS — Z833 Family history of diabetes mellitus: Secondary | ICD-10-CM | POA: Diagnosis not present

## 2021-08-14 DIAGNOSIS — R10814 Left lower quadrant abdominal tenderness: Secondary | ICD-10-CM | POA: Diagnosis not present

## 2021-08-14 DIAGNOSIS — R634 Abnormal weight loss: Secondary | ICD-10-CM | POA: Diagnosis not present

## 2021-08-14 DIAGNOSIS — K5792 Diverticulitis of intestine, part unspecified, without perforation or abscess without bleeding: Principal | ICD-10-CM | POA: Diagnosis present

## 2021-08-14 DIAGNOSIS — K5090 Crohn's disease, unspecified, without complications: Secondary | ICD-10-CM | POA: Diagnosis present

## 2021-08-14 DIAGNOSIS — K5790 Diverticulosis of intestine, part unspecified, without perforation or abscess without bleeding: Secondary | ICD-10-CM | POA: Diagnosis not present

## 2021-08-14 DIAGNOSIS — K769 Liver disease, unspecified: Secondary | ICD-10-CM | POA: Diagnosis not present

## 2021-08-14 DIAGNOSIS — E876 Hypokalemia: Secondary | ICD-10-CM | POA: Diagnosis present

## 2021-08-14 DIAGNOSIS — I10 Essential (primary) hypertension: Secondary | ICD-10-CM | POA: Diagnosis present

## 2021-08-14 DIAGNOSIS — F419 Anxiety disorder, unspecified: Secondary | ICD-10-CM | POA: Diagnosis not present

## 2021-08-14 DIAGNOSIS — K5720 Diverticulitis of large intestine with perforation and abscess without bleeding: Secondary | ICD-10-CM | POA: Diagnosis not present

## 2021-08-14 DIAGNOSIS — D72829 Elevated white blood cell count, unspecified: Secondary | ICD-10-CM

## 2021-08-14 DIAGNOSIS — K6389 Other specified diseases of intestine: Secondary | ICD-10-CM | POA: Diagnosis not present

## 2021-08-14 HISTORY — DX: Diverticulitis of intestine, part unspecified, without perforation or abscess without bleeding: K57.92

## 2021-08-14 LAB — URINALYSIS, ROUTINE W REFLEX MICROSCOPIC
Bilirubin Urine: NEGATIVE
Glucose, UA: NEGATIVE mg/dL
Hgb urine dipstick: NEGATIVE
Ketones, ur: NEGATIVE mg/dL
Leukocytes,Ua: NEGATIVE
Nitrite: NEGATIVE
Protein, ur: NEGATIVE mg/dL
Specific Gravity, Urine: 1.041 — ABNORMAL HIGH (ref 1.005–1.030)
pH: 6.5 (ref 5.0–8.0)

## 2021-08-14 LAB — COMPREHENSIVE METABOLIC PANEL
ALT: 11 U/L (ref 0–44)
AST: 11 U/L — ABNORMAL LOW (ref 15–41)
Albumin: 4.3 g/dL (ref 3.5–5.0)
Alkaline Phosphatase: 69 U/L (ref 38–126)
Anion gap: 10 (ref 5–15)
BUN: 6 mg/dL (ref 6–20)
CO2: 27 mmol/L (ref 22–32)
Calcium: 9.4 mg/dL (ref 8.9–10.3)
Chloride: 101 mmol/L (ref 98–111)
Creatinine, Ser: 0.84 mg/dL (ref 0.61–1.24)
GFR, Estimated: >60 mL/min (ref >60)
Glucose, Bld: 125 mg/dL — ABNORMAL HIGH (ref 70–99)
Potassium: 3 mmol/L — ABNORMAL LOW (ref 3.5–5.1)
Sodium: 138 mmol/L (ref 135–145)
Total Bilirubin: 2 mg/dL — ABNORMAL HIGH (ref 0.3–1.2)
Total Protein: 7.5 g/dL (ref 6.5–8.1)

## 2021-08-14 LAB — CBC WITH DIFFERENTIAL/PLATELET
Abs Immature Granulocytes: 0.04 10*3/uL (ref 0.00–0.07)
Basophils Absolute: 0.1 10*3/uL (ref 0.0–0.1)
Basophils Relative: 0 %
Eosinophils Absolute: 0 10*3/uL (ref 0.0–0.5)
Eosinophils Relative: 0 %
HCT: 42.7 % (ref 39.0–52.0)
Hemoglobin: 14.6 g/dL (ref 13.0–17.0)
Immature Granulocytes: 0 %
Lymphocytes Relative: 17 %
Lymphs Abs: 2.2 10*3/uL (ref 0.7–4.0)
MCH: 29.9 pg (ref 26.0–34.0)
MCHC: 34.2 g/dL (ref 30.0–36.0)
MCV: 87.3 fL (ref 80.0–100.0)
Monocytes Absolute: 0.9 10*3/uL (ref 0.1–1.0)
Monocytes Relative: 7 %
Neutro Abs: 9.6 10*3/uL — ABNORMAL HIGH (ref 1.7–7.7)
Neutrophils Relative %: 76 %
Platelets: 284 10*3/uL (ref 150–400)
RBC: 4.89 MIL/uL (ref 4.22–5.81)
RDW: 14 % (ref 11.5–15.5)
WBC: 12.7 10*3/uL — ABNORMAL HIGH (ref 4.0–10.5)
nRBC: 0 % (ref 0.0–0.2)

## 2021-08-14 LAB — LIPASE, BLOOD: Lipase: 31 U/L (ref 11–51)

## 2021-08-14 MED ORDER — IOPAMIDOL (ISOVUE-300) INJECTION 61%
100.0000 mL | Freq: Once | INTRAVENOUS | Status: AC | PRN
  Administered 2021-08-14: 100 mL via INTRAVENOUS

## 2021-08-14 MED ORDER — SODIUM CHLORIDE 0.9 % IV SOLN
2.0000 g | Freq: Once | INTRAVENOUS | Status: AC
  Administered 2021-08-14: 2 g via INTRAVENOUS
  Filled 2021-08-14: qty 20

## 2021-08-14 MED ORDER — METRONIDAZOLE 500 MG/100ML IV SOLN
500.0000 mg | Freq: Once | INTRAVENOUS | Status: AC
  Administered 2021-08-14: 500 mg via INTRAVENOUS
  Filled 2021-08-14: qty 100

## 2021-08-14 MED ORDER — SODIUM CHLORIDE 0.9 % IV SOLN: INTRAVENOUS | Status: DC | PRN

## 2021-08-14 NOTE — ED Triage Notes (Signed)
Pt had CT scan done earlier today for abd pain and was sent here to further evaluate his results. Result report in chart.

## 2021-08-14 NOTE — Progress Notes (Signed)
51 yo old male with HTN and anxiety now with 7 days of gen abd pain and 1 day of focal LLQ pain.  Pt had CT which was notable for:  1. Focal wall thickening in the distal descending colon with ill-defined anterior diverticulum and pericolonic edema/inflammation. Imaging features consistent with diverticulitis. No evidence for perforation. While not definite, 11 mm hypoattenuating focus in the wall of the colon in this region raises the question of a tiny intramural abscess. 2. Wall thickening in the terminal ileum without perienteric edema or inflammation. No findings in the terminal ileum to suggest fibrous or inflammatory stricture.  Case was discussed with Dr Cliffton Asters of gen surgery who recommended IV abx and gen surg consultation .  Pt is afebrile hr 61 rr16 bp 110/76 96% on RA WITH MARGINALY ELEVATED WBC AT 12.7

## 2021-08-14 NOTE — Consult Note (Incomplete)
CC/Reason for consult: Diverticulitis with intramural abscess  Requesting physician: Madalyn Rob MD  HPI: Roy Ross is an 51 y.o. male hx HTN presented to [facility] with reported 7d hx of somewhat vague abdominal pain that became localized to LLQ over last day. ***   Past Medical History:  Diagnosis Date   Abscess    pectoralis major   Hypertension     History reviewed. No pertinent surgical history.  Family History  Problem Relation Age of Onset   Hypertension Father    Heart disease Father        pacemaker   Diabetes Father    Cancer Father        bladder    Social:  reports that he quit smoking about 14 years ago. His smoking use included cigarettes. He has never used smokeless tobacco. He reports that he does not drink alcohol and does not use drugs.  Allergies:  Allergies  Allergen Reactions   Penicillins     Pt was told as child    Medications: I have reviewed the patient's current medications.  Results for orders placed or performed during the hospital encounter of 08/14/21 (from the past 48 hour(s))  CBC with Differential     Status: Abnormal   Collection Time: 08/14/21  4:23 PM  Result Value Ref Range   WBC 12.7 (H) 4.0 - 10.5 K/uL   RBC 4.89 4.22 - 5.81 MIL/uL   Hemoglobin 14.6 13.0 - 17.0 g/dL   HCT 42.7 39.0 - 52.0 %   MCV 87.3 80.0 - 100.0 fL   MCH 29.9 26.0 - 34.0 pg   MCHC 34.2 30.0 - 36.0 g/dL   RDW 14.0 11.5 - 15.5 %   Platelets 284 150 - 400 K/uL   nRBC 0.0 0.0 - 0.2 %   Neutrophils Relative % 76 %   Neutro Abs 9.6 (H) 1.7 - 7.7 K/uL   Lymphocytes Relative 17 %   Lymphs Abs 2.2 0.7 - 4.0 K/uL   Monocytes Relative 7 %   Monocytes Absolute 0.9 0.1 - 1.0 K/uL   Eosinophils Relative 0 %   Eosinophils Absolute 0.0 0.0 - 0.5 K/uL   Basophils Relative 0 %   Basophils Absolute 0.1 0.0 - 0.1 K/uL   Immature Granulocytes 0 %   Abs Immature Granulocytes 0.04 0.00 - 0.07 K/uL    Comment: Performed at [facility], [place], [place] [address]  Comprehensive metabolic panel     Status: Abnormal   Collection Time: 08/14/21  4:23 PM  Result Value Ref Range   Sodium 138 135 - 145 mmol/L   Potassium 3.0 (L) 3.5 - 5.1 mmol/L   Chloride 101 98 - 111 mmol/L   CO2 27 22 - 32 mmol/L   Glucose, Bld 125 (H) 70 - 99 mg/dL    Comment: Glucose reference range applies only to samples taken after fasting for at least 8 hours.   BUN 6 6 - 20 mg/dL   Creatinine, Ser 0.84 0.61 - 1.24 mg/dL   Calcium 9.4 8.9 - 10.3 mg/dL   Total Protein 7.5 6.5 - 8.1 g/dL   Albumin 4.3 3.5 - 5.0 g/dL   AST 11 (L) 15 - 41 U/L   ALT 11 0 - 44 U/L   Alkaline Phosphatase 69 38 - 126 U/L   Total Bilirubin 2.0 (H) 0.3 - 1.2 mg/dL   GFR, Estimated >60 >60 mL/min    Comment: (NOTE) Calculated using the CKD-EPI  Creatinine Equation (2021)    Anion gap 10 5 - 15    Comment: Performed at [facility], [address], [place], [place] [address]  Lipase, blood     Status: None   Collection Time: 08/14/21  4:23 PM  Result Value Ref Range   Lipase 31 11 - 51 U/L    Comment: Performed at [facility], [address] [place], [place] [address]    CT ABDOMEN PELVIS W CONTRAST  Result Date: 08/14/2021 CLINICAL DATA:  Left lower quadrant pain with loose stools. Abdominal tenderness. History of Crohn disease. Weight loss. EXAM: CT ABDOMEN AND PELVIS WITH CONTRAST TECHNIQUE: Multidetector CT imaging of the abdomen and pelvis was performed using the standard protocol following bolus administration of intravenous contrast. RADIATION DOSE REDUCTION: This exam was performed according to the departmental dose-optimization program which includes automated exposure control, adjustment of the mA and/or kV according to patient size and/or use of iterative reconstruction technique. CONTRAST:  ISOVUE-300 IOPAMIDOL (ISOVUE-300) INJECTION 61% COMPARISON:  None Available. FINDINGS: Lower  chest: Unremarkable. Hepatobiliary: 6 mm hypoattenuating lesion in the inferior tip of the right liver is too small to characterize but likely benign. There is no evidence for gallstones, gallbladder wall thickening, or pericholecystic fluid. No intrahepatic or extrahepatic biliary dilation. Pancreas: No focal mass lesion. No dilatation of the main duct. No intraparenchymal cyst. No peripancreatic edema. Spleen: No splenomegaly. No focal mass lesion. Adrenals/Urinary Tract: No adrenal nodule or mass. Kidneys unremarkable. No evidence for hydroureter. The urinary bladder appears normal for the degree of distention. Stomach/Bowel: Stomach is unremarkable. No gastric wall thickening. No evidence of outlet obstruction. Duodenum is normally positioned as is the ligament of Treitz. No small bowel wall thickening. No small bowel dilatation. Wall thickening in the terminal ileum evident without perienteric edema or inflammation. No findings in the terminal ileum to suggest fibrous or inflammatory stricture. The appendix is normal. Focal wall thickening of the distal descending colon is identified within ill-defined anterior diverticulum (57/4) and pericolonic edema/inflammation. No evidence for extraluminal gas. 11 mm hypoattenuating focus in the adjacent colonic wall may be a tiny intramural abscess. Vascular/Lymphatic: No abdominal aortic aneurysm. There is no gastrohepatic or hepatoduodenal ligament lymphadenopathy. No retroperitoneal or mesenteric lymphadenopathy. No pelvic sidewall lymphadenopathy. Reproductive: The prostate gland and seminal vesicles are unremarkable. Other: No intraperitoneal free fluid. Musculoskeletal: No worrisome lytic or sclerotic osseous abnormality. IMPRESSION: 1. Focal wall thickening in the distal descending colon with ill-defined anterior diverticulum and pericolonic edema/inflammation. Imaging features consistent with diverticulitis. No evidence for perforation. While not definite, 11 mm  hypoattenuating focus in the wall of the colon in this region raises the question of a tiny intramural abscess. 2. Wall thickening in the terminal ileum without perienteric edema or inflammation. No findings in the terminal ileum to suggest fibrous or inflammatory stricture. Electronically Signed   By: Kennith Center M.D.   On: 08/14/2021 15:00    ROS - all of the below systems have been reviewed with the patient and positives are indicated with bold text General: chills, fever or night sweats Eyes: blurry vision or double vision ENT: epistaxis or sore throat Allergy/Immunology: itchy/watery eyes or nasal congestion Hematologic/Lymphatic: bleeding problems, blood clots or swollen lymph nodes Endocrine: temperature intolerance or unexpected weight changes Breast: new or changing breast lumps or nipple discharge Resp: cough, shortness of breath, or wheezing CV: chest pain or dyspnea on exertion GI: as per HPI GU: dysuria, trouble voiding, or hematuria MSK: joint pain or joint  stiffness Neuro: TIA or stroke symptoms Derm: pruritus and skin lesion changes Psych: anxiety and depression  PE Blood pressure 110/76, pulse 61, temperature 98.5 F (36.9 C), temperature source Oral, resp. rate 16, height 5\' 6"  (1.676 m), weight 82.6 kg, SpO2 96 %. Constitutional: NAD; conversant; no deformities Eyes: Moist conjunctiva; no lid lag; anicteric; PERRL Neck: Trachea midline; no thyromegaly Lungs: Normal respiratory effort; no tactile fremitus CV: RRR; no palpable thrills; no pitting edema GI: Abd ***; no palpable hepatosplenomegaly MSK: ***Normal range of motion of extremities; no clubbing/cyanosis Psychiatric: Appropriate affect; alert and oriented x3 Lymphatic: No palpable cervical or axillary lymphadenopathy  Results for orders placed or performed during the hospital encounter of 08/14/21 (from the past 48 hour(s))  CBC with Differential     Status: Abnormal   Collection Time: 08/14/21  4:23 PM   Result Value Ref Range   WBC 12.7 (H) 4.0 - 10.5 K/uL   RBC 4.89 4.22 - 5.81 MIL/uL   Hemoglobin 14.6 13.0 - 17.0 g/dL   HCT 10/15/21 56.9 - 79.4 %   MCV 87.3 80.0 - 100.0 fL   MCH 29.9 26.0 - 34.0 pg   MCHC 34.2 30.0 - 36.0 g/dL   RDW 80.1 65.5 - 37.4 %   Platelets 284 150 - 400 K/uL   nRBC 0.0 0.0 - 0.2 %   Neutrophils Relative % 76 %   Neutro Abs 9.6 (H) 1.7 - 7.7 K/uL   Lymphocytes Relative 17 %   Lymphs Abs 2.2 0.7 - 4.0 K/uL   Monocytes Relative 7 %   Monocytes Absolute 0.9 0.1 - 1.0 K/uL   Eosinophils Relative 0 %   Eosinophils Absolute 0.0 0.0 - 0.5 K/uL   Basophils Relative 0 %   Basophils Absolute 0.1 0.0 - 0.1 K/uL   Immature Granulocytes 0 %   Abs Immature Granulocytes 0.04 0.00 - 0.07 K/uL    Comment: Performed at [facility], [address] [place], [place]  Comprehensive metabolic panel     Status: Abnormal   Collection Time: 08/14/21  4:23 PM  Result Value Ref Range   Sodium 138 135 - 145 mmol/L   Potassium 3.0 (L) 3.5 - 5.1 mmol/L   Chloride 101 98 - 111 mmol/L   CO2 27 22 - 32 mmol/L   Glucose, Bld 125 (H) 70 - 99 mg/dL    Comment: Glucose reference range applies only to samples taken after fasting for at least 8 hours.   BUN 6 6 - 20 mg/dL   Creatinine, Ser 10/15/21 0.61 - 1.24 mg/dL   Calcium 9.4 8.9 - 5.44 mg/dL   Total Protein 7.5 6.5 - 8.1 g/dL   Albumin 4.3 3.5 - 5.0 g/dL   AST 11 (L) 15 - 41 U/L   ALT 11 0 - 44 U/L   Alkaline Phosphatase 69 38 - 126 U/L   Total Bilirubin 2.0 (H) 0.3 - 1.2 mg/dL   GFR, Estimated 92.0 >10 mL/min    Comment: (NOTE) Calculated using the CKD-EPI Creatinine Equation (2021)    Anion gap 10 5 - 15    Comment: Performed at 02-04-1972, [address], [place], [place]  Lipase, blood     Status: None   Collection Time: 08/14/21  4:23 PM  Result Value Ref Range   Lipase 31 11 - 51 U/L    Comment: Performed at 10/15/21, [address] [place], [place]    CT ABDOMEN PELVIS W  CONTRAST  Result Date: 08/14/2021 CLINICAL DATA:  Left lower quadrant pain with loose stools. Abdominal tenderness. History of Crohn disease. Weight loss. EXAM: CT ABDOMEN AND PELVIS WITH CONTRAST TECHNIQUE: Multidetector CT imaging of the abdomen and pelvis was performed using the standard protocol following bolus administration of intravenous contrast. RADIATION DOSE REDUCTION: This exam was performed according to the departmental dose-optimization program which includes automated exposure control, adjustment of the mA and/or kV according to patient size and/or use of iterative reconstruction technique. CONTRAST:  140mL ISOVUE-300 IOPAMIDOL (ISOVUE-300) INJECTION 61% COMPARISON:  None Available. FINDINGS: Lower chest: Unremarkable. Hepatobiliary: 6 mm hypoattenuating lesion in the inferior tip of the right liver is too small to characterize but likely benign. There is no evidence for gallstones, gallbladder wall thickening, or pericholecystic fluid. No intrahepatic or extrahepatic biliary dilation. Pancreas: No focal mass lesion. No dilatation of the main duct. No intraparenchymal cyst. No peripancreatic edema. Spleen: No splenomegaly. No focal mass lesion. Adrenals/Urinary Tract: No adrenal nodule or mass. Kidneys unremarkable. No evidence for hydroureter. The urinary bladder appears normal for the degree of distention. Stomach/Bowel: Stomach is unremarkable. No gastric wall thickening. No evidence of outlet obstruction. Duodenum is normally positioned as is the ligament of Treitz. No small bowel wall thickening. No small bowel dilatation. Wall thickening in the terminal ileum evident without perienteric edema or inflammation. No findings in the terminal ileum to suggest fibrous or inflammatory stricture. The appendix is normal. Focal wall thickening of the distal descending colon is identified within ill-defined anterior diverticulum (57/4) and pericolonic  edema/inflammation. No evidence for extraluminal gas. 11 mm hypoattenuating focus in the adjacent colonic wall may be a tiny intramural abscess. Vascular/Lymphatic: No abdominal aortic aneurysm. There is no gastrohepatic or hepatoduodenal ligament lymphadenopathy. No retroperitoneal or mesenteric lymphadenopathy. No pelvic sidewall lymphadenopathy. Reproductive: The prostate gland and seminal vesicles are unremarkable. Other: No intraperitoneal free fluid. Musculoskeletal: No worrisome lytic or sclerotic osseous abnormality. IMPRESSION: 1. Focal wall thickening in the distal descending colon with ill-defined anterior diverticulum and pericolonic edema/inflammation. Imaging features consistent with diverticulitis. No evidence for perforation. While not definite, 11 mm hypoattenuating focus in the wall of the colon in this region raises the question of a tiny intramural abscess. 2. Wall thickening in the terminal ileum without perienteric edema or inflammation. No findings in the terminal ileum to suggest fibrous or inflammatory stricture. Electronically Signed   By: Misty Stanley M.D.   On: 08/14/2021 15:00    I have personally reviewed the relevant CT A/P 08/14/21; CBC, CMP  A/P: Roy Ross is an 51 y.o. male with HTN here with acute proximal sigmoid diverticulitis with associated small intramural abscess  -***  I spent a total of *** minutes in both face-to-face and non-face-to-face activities, excluding procedures performed, for this visit on the date of this encounter.  Nadeen Landau, [facility], [place]

## 2021-08-14 NOTE — ED Provider Notes (Signed)
[facility] EMERGENCY DEPT Provider Note   CSN: [id number] Arrival date & time: 08/14/21  1554     History  Chief Complaint  Patient presents with   Abnormal Lab    Ferry Matthis is a 51 y.o. male.  Presented to ER due to concern for abdominal pain.  Patient reports that a week or so ago he had some mild stomach discomfort, nauseous feeling.  Yesterday started having some abdominal pain.  Pain is on left side of abdomen.  Nonradiating, no relieving or aggravating factors.  Currently 1 or 2 out of 10 in severity.  Additional history obtained from discussion with patient's primary care provider, [place] Pink.  She states that she saw patient earlier today, well-appearing, abdominal pain, check CT scan.  CT scan findings concerning for diverticulitis with small abscess.  Patient reports he is on steroids for possible myasthenia gravis, has not had any change in his speech since starting the steroids 2 weeks ago.  Has not noted any worsening of his neurologic issues.  Reviewed CT report - IMPRESSION: 1. Focal wall thickening in the distal descending colon with ill-defined anterior diverticulum and pericolonic edema/inflammation. Imaging features consistent with diverticulitis. No evidence for perforation. While not definite, 11 mm hypoattenuating focus in the wall of the colon in this region raises the question of a tiny intramural abscess. 2. Wall thickening in the terminal ileum without perienteric edema or inflammation. No findings in the terminal ileum to suggest fibrous or inflammatory stricture.  HPI     Home Medications Prior to Admission medications   Medication Sig Start Date End Date Taking? Authorizing Provider  amLODipine (NORVASC) 5 MG tablet Daily. 08/14/10   [provider]  citalopram (CELEXA) 40 MG tablet 1 tablet    [provider]  losartan-hydrochlorothiazide (HYZAAR) 50-12.5 MG tablet Take 1 tablet by mouth every morning.  10/09/20   [provider]  Mesalamine 800 MG TBEC Take 2 tablets by mouth 3 (three) times daily. 10/29/20   [provider]  pantoprazole (PROTONIX) 40 MG tablet Take 40 mg by mouth 2 (two) times daily. 08/01/20   [provider]      Allergies    Penicillins    Review of Systems   Review of Systems  Constitutional:  Negative for chills and fever.  HENT:  Negative for ear pain and sore throat.   Eyes:  Negative for pain and visual disturbance.  Respiratory:  Negative for cough and shortness of breath.   Cardiovascular:  Negative for chest pain and palpitations.  Gastrointestinal:  Positive for abdominal pain. Negative for vomiting.  Genitourinary:  Negative for dysuria and hematuria.  Musculoskeletal:  Negative for arthralgias and back pain.  Skin:  Negative for color change and rash.  Neurological:  Negative for seizures and syncope.  All other systems reviewed and are negative.   Physical Exam Updated Vital Signs BP 110/76   Pulse 61   Temp 98.5 F (36.9 C) (Oral)   Resp 16   Ht 5\' 6"  (1.676 m)   Wt 82.6 kg   SpO2 96%   BMI 29.38 kg/m  Physical Exam Vitals and nursing note reviewed.  Constitutional:      General: He is not in acute distress.    Appearance: He is well-developed.  HENT:     Head: Normocephalic and atraumatic.  Eyes:     Conjunctiva/sclera: Conjunctivae normal.  Cardiovascular:     Rate and Rhythm: Normal rate and regular rhythm.  Heart sounds: No murmur heard. Pulmonary:     Effort: Pulmonary effort is normal. No respiratory distress.     Breath sounds: Normal breath sounds.  Abdominal:     Palpations: Abdomen is soft.     Tenderness: There is abdominal tenderness.     Comments: Some tenderness to palpation in left lower quadrant but no rebound or guarding  Musculoskeletal:        General: No swelling or deformity.     Cervical back: Neck supple.  Skin:    General: Skin is warm and dry.     Capillary Refill:  Capillary refill takes less than 2 seconds.  Neurological:     General: No focal deficit present.     Mental Status: He is alert.  Psychiatric:        Mood and Affect: Mood normal.     ED Results / Procedures / Treatments   Labs (all labs ordered are listed, but only abnormal results are displayed) Labs Reviewed  CBC WITH DIFFERENTIAL/PLATELET - Abnormal; Notable for the following components:      Result Value   WBC 12.7 (*)    Neutro Abs 9.6 (*)    All other components within normal limits  COMPREHENSIVE METABOLIC PANEL - Abnormal; Notable for the following components:   Potassium 3.0 (*)    Glucose, Bld 125 (*)    AST 11 (*)    Total Bilirubin 2.0 (*)    All other components within normal limits  LIPASE, BLOOD  URINALYSIS, ROUTINE W REFLEX MICROSCOPIC    EKG None  Radiology CT ABDOMEN PELVIS W CONTRAST  Result Date: 08/14/2021 CLINICAL DATA:  Left lower quadrant pain with loose stools. Abdominal tenderness. History of Crohn disease. Weight loss. EXAM: CT ABDOMEN AND PELVIS WITH CONTRAST TECHNIQUE: Multidetector CT imaging of the abdomen and pelvis was performed using the standard protocol following bolus administration of intravenous contrast. RADIATION DOSE REDUCTION: This exam was performed according to the departmental dose-optimization program which includes automated exposure control, adjustment of the mA and/or kV according to patient size and/or use of iterative reconstruction technique. CONTRAST:  ISOVUE-300 IOPAMIDOL (ISOVUE-300) INJECTION 61% COMPARISON:  None Available. FINDINGS: Lower chest: Unremarkable. Hepatobiliary: 6 mm hypoattenuating lesion in the inferior tip of the right liver is too small to characterize but likely benign. There is no evidence for gallstones, gallbladder wall thickening, or pericholecystic fluid. No intrahepatic or extrahepatic biliary dilation. Pancreas: No focal mass lesion. No dilatation of the main duct. No intraparenchymal cyst. No  peripancreatic edema. Spleen: No splenomegaly. No focal mass lesion. Adrenals/Urinary Tract: No adrenal nodule or mass. Kidneys unremarkable. No evidence for hydroureter. The urinary bladder appears normal for the degree of distention. Stomach/Bowel: Stomach is unremarkable. No gastric wall thickening. No evidence of outlet obstruction. Duodenum is normally positioned as is the ligament of Treitz. No small bowel wall thickening. No small bowel dilatation. Wall thickening in the terminal ileum evident without perienteric edema or inflammation. No findings in the terminal ileum to suggest fibrous or inflammatory stricture. The appendix is normal. Focal wall thickening of the distal descending colon is identified within ill-defined anterior diverticulum (57/4) and pericolonic edema/inflammation. No evidence for extraluminal gas. 11 mm hypoattenuating focus in the adjacent colonic wall may be a tiny intramural abscess. Vascular/Lymphatic: No abdominal aortic aneurysm. There is no gastrohepatic or hepatoduodenal ligament lymphadenopathy. No retroperitoneal or mesenteric lymphadenopathy. No pelvic sidewall lymphadenopathy. Reproductive: The prostate gland and seminal vesicles are unremarkable. Other: No intraperitoneal free fluid. Musculoskeletal: No worrisome  lytic or sclerotic osseous abnormality. IMPRESSION: 1. Focal wall thickening in the distal descending colon with ill-defined anterior diverticulum and pericolonic edema/inflammation. Imaging features consistent with diverticulitis. No evidence for perforation. While not definite, 11 mm hypoattenuating focus in the wall of the colon in this region raises the question of a tiny intramural abscess. 2. Wall thickening in the terminal ileum without perienteric edema or inflammation. No findings in the terminal ileum to suggest fibrous or inflammatory stricture. Electronically Signed   By: Kennith Center M.D.   On: 08/14/2021 15:00    Procedures Procedures     Medications Ordered in ED Medications  cefTRIAXone (ROCEPHIN) 2 g in sodium chloride 0.9 % 100 mL IVPB (0 g Intravenous Stopped 08/14/21 1826)    And  metroNIDAZOLE (FLAGYL) IVPB 500 mg (500 mg Intravenous New Bag/Given 08/14/21 1859)  0.9 %  sodium chloride infusion ( Intravenous New Bag/Given 08/14/21 1751)    ED Course/ Medical Decision Making/ A&P                           Medical Decision Making Amount and/or Complexity of Data Reviewed Labs: ordered.  Risk Prescription drug management. Decision regarding hospitalization.   51 year old male presents for left-sided abdominal pain.  Patient CT scan demonstrate diverticulitis, small intramural abscess.  Examined patient, he does have some tenderness in his left lower quadrant.  No rebound or guarding, no fever, well-appearing.  Basic labs reviewed, mild leukocytosis noted.  I discussed the case with Dr. Cliffton Asters with general surgery.  Advises medicine admission, they will consult on patient, recommending antibiotics for now.  Placed orders for IV antibiotics, consulted [facility] for admit.  Patient agreeable to admission.  Remains well-appearing on reassessment.  I independently reviewed and interpreted CT images from earlier today, additional history from chart review, additional history from discussion with patient's primary care provider, PA.        Final Clinical Impression(s) / ED Diagnoses Final diagnoses:  Diverticulitis  Diverticulitis of large intestine with abscess without bleeding  Leukocytosis, unspecified type    Rx / DC Orders ED Discharge Orders     None         Milagros Loll, MD 08/14/21 1920

## 2021-08-15 ENCOUNTER — Encounter (HOSPITAL_COMMUNITY): Payer: Self-pay | Admitting: Internal Medicine

## 2021-08-15 DIAGNOSIS — K5792 Diverticulitis of intestine, part unspecified, without perforation or abscess without bleeding: Secondary | ICD-10-CM

## 2021-08-15 LAB — GLUCOSE, CAPILLARY: Glucose-Capillary: 83 mg/dL (ref 70–99)

## 2021-08-15 LAB — COMPREHENSIVE METABOLIC PANEL
ALT: 13 U/L (ref 0–44)
AST: 12 U/L — ABNORMAL LOW (ref 15–41)
Albumin: 3.4 g/dL — ABNORMAL LOW (ref 3.5–5.0)
Alkaline Phosphatase: 60 U/L (ref 38–126)
Anion gap: 9 (ref 5–15)
BUN: 11 mg/dL (ref 6–20)
CO2: 29 mmol/L (ref 22–32)
Calcium: 8.8 mg/dL — ABNORMAL LOW (ref 8.9–10.3)
Chloride: 104 mmol/L (ref 98–111)
Creatinine, Ser: 0.87 mg/dL (ref 0.61–1.24)
GFR, Estimated: >60 mL/min (ref >60)
Glucose, Bld: 87 mg/dL (ref 70–99)
Potassium: 3.7 mmol/L (ref 3.5–5.1)
Sodium: 142 mmol/L (ref 135–145)
Total Bilirubin: 1.3 mg/dL — ABNORMAL HIGH (ref 0.3–1.2)
Total Protein: 7 g/dL (ref 6.5–8.1)

## 2021-08-15 LAB — CBC
HCT: 41.4 % (ref 39.0–52.0)
HCT: 42 % (ref 39.0–52.0)
Hemoglobin: 13.8 g/dL (ref 13.0–17.0)
Hemoglobin: 13.6 g/dL (ref 13.0–17.0)
MCH: 29.9 pg (ref 26.0–34.0)
MCH: 29.3 pg (ref 26.0–34.0)
MCHC: 33.3 g/dL (ref 30.0–36.0)
MCHC: 32.4 g/dL (ref 30.0–36.0)
MCV: 89.6 fL (ref 80.0–100.0)
MCV: 90.5 fL (ref 80.0–100.0)
Platelets: 246 10*3/uL (ref 150–400)
Platelets: 238 10*3/uL (ref 150–400)
RBC: 4.62 MIL/uL (ref 4.22–5.81)
RBC: 4.64 MIL/uL (ref 4.22–5.81)
RDW: 14.1 % (ref 11.5–15.5)
RDW: 14.1 % (ref 11.5–15.5)
WBC: 9.2 10*3/uL (ref 4.0–10.5)
WBC: 8.7 10*3/uL (ref 4.0–10.5)
nRBC: 0 % (ref 0.0–0.2)
nRBC: 0 % (ref 0.0–0.2)

## 2021-08-15 LAB — BASIC METABOLIC PANEL
Anion gap: 5 (ref 5–15)
BUN: 10 mg/dL (ref 6–20)
CO2: 29 mmol/L (ref 22–32)
Calcium: 8.6 mg/dL — ABNORMAL LOW (ref 8.9–10.3)
Chloride: 104 mmol/L (ref 98–111)
Creatinine, Ser: 0.91 mg/dL (ref 0.61–1.24)
GFR, Estimated: >60 mL/min (ref >60)
Glucose, Bld: 94 mg/dL (ref 70–99)
Potassium: 3.3 mmol/L — ABNORMAL LOW (ref 3.5–5.1)
Sodium: 138 mmol/L (ref 135–145)

## 2021-08-15 LAB — HIV ANTIBODY (ROUTINE TESTING W REFLEX): HIV Screen 4th Generation wRfx: NONREACTIVE

## 2021-08-15 LAB — LACTIC ACID, PLASMA
Lactic Acid, Venous: 0.9 mmol/L (ref 0.5–1.9)
Lactic Acid, Venous: 0.8 mmol/L (ref 0.5–1.9)

## 2021-08-15 LAB — C-REACTIVE PROTEIN: CRP: 14 mg/dL — ABNORMAL HIGH (ref <1.0)

## 2021-08-15 LAB — PROCALCITONIN: Procalcitonin: <0.1 ng/mL

## 2021-08-15 LAB — TSH: TSH: 3.497 u[IU]/mL (ref 0.350–4.500)

## 2021-08-15 LAB — MAGNESIUM: Magnesium: 2.3 mg/dL (ref 1.7–2.4)

## 2021-08-15 MED ORDER — KCL IN DEXTROSE-NACL 20-5-0.9 MEQ/L-%-% IV SOLN
INTRAVENOUS | Status: DC
  Filled 2021-08-15 (×6): qty 1000

## 2021-08-15 MED ORDER — SODIUM CHLORIDE 0.9 % IV SOLN
2.0000 g | INTRAVENOUS | Status: DC
  Administered 2021-08-15 – 2021-08-16 (×2): 2 g via INTRAVENOUS
  Filled 2021-08-15 (×2): qty 20

## 2021-08-15 MED ORDER — ALBUTEROL SULFATE (2.5 MG/3ML) 0.083% IN NEBU: 2.5000 mg | INHALATION_SOLUTION | RESPIRATORY_TRACT | Status: DC | PRN

## 2021-08-15 MED ORDER — SODIUM CHLORIDE 0.9 % IV SOLN: INTRAVENOUS | Status: DC

## 2021-08-15 MED ORDER — METRONIDAZOLE 500 MG/100ML IV SOLN
500.0000 mg | Freq: Two times a day (BID) | INTRAVENOUS | Status: DC
Start: 2021-08-15 — End: 2021-08-17
  Administered 2021-08-15 – 2021-08-17 (×5): 500 mg via INTRAVENOUS
  Filled 2021-08-15 (×5): qty 100

## 2021-08-15 MED ORDER — ACETAMINOPHEN 325 MG PO TABS: 650.0000 mg | ORAL_TABLET | Freq: Four times a day (QID) | ORAL | Status: DC | PRN

## 2021-08-15 MED ORDER — ONDANSETRON HCL 4 MG/2ML IJ SOLN: 4.0000 mg | Freq: Four times a day (QID) | INTRAMUSCULAR | Status: DC | PRN

## 2021-08-15 MED ORDER — PREDNISONE 5 MG PO TABS
10.0000 mg | ORAL_TABLET | Freq: Every day | ORAL | Status: DC
Start: 2021-08-15 — End: 2021-08-15
  Administered 2021-08-15: 10 mg via ORAL
  Filled 2021-08-15: qty 2

## 2021-08-15 MED ORDER — KETOROLAC TROMETHAMINE 15 MG/ML IJ SOLN: 15.0000 mg | Freq: Four times a day (QID) | INTRAMUSCULAR | Status: DC | PRN

## 2021-08-15 MED ORDER — PYRIDOSTIGMINE BROMIDE 60 MG PO TABS
60.0000 mg | ORAL_TABLET | Freq: Three times a day (TID) | ORAL | Status: DC
  Administered 2021-08-15 – 2021-08-17 (×7): 60 mg via ORAL
  Filled 2021-08-15 (×9): qty 1

## 2021-08-15 MED ORDER — ONDANSETRON HCL 4 MG PO TABS: 4.0000 mg | ORAL_TABLET | Freq: Four times a day (QID) | ORAL | Status: DC | PRN

## 2021-08-15 MED ORDER — POTASSIUM CHLORIDE 10 MEQ/100ML IV SOLN: 10.0000 meq | INTRAVENOUS | Status: DC

## 2021-08-15 MED ORDER — HEPARIN SODIUM (PORCINE) 5000 UNIT/ML IJ SOLN
5000.0000 [IU] | Freq: Three times a day (TID) | INTRAMUSCULAR | Status: DC
Start: 2021-08-15 — End: 2021-08-17
  Administered 2021-08-15 – 2021-08-17 (×8): 5000 [IU] via SUBCUTANEOUS
  Filled 2021-08-15 (×7): qty 1

## 2021-08-15 MED ORDER — MESALAMINE 400 MG PO CPDR
1600.0000 mg | DELAYED_RELEASE_CAPSULE | Freq: Three times a day (TID) | ORAL | Status: DC
  Filled 2021-08-15 (×9): qty 4

## 2021-08-15 MED ORDER — CITALOPRAM HYDROBROMIDE 20 MG PO TABS
40.0000 mg | ORAL_TABLET | Freq: Every day | ORAL | Status: DC
  Administered 2021-08-15 – 2021-08-17 (×3): 40 mg via ORAL
  Filled 2021-08-15 (×3): qty 2

## 2021-08-15 MED ORDER — ACETAMINOPHEN 650 MG RE SUPP: 650.0000 mg | Freq: Four times a day (QID) | RECTAL | Status: DC | PRN

## 2021-08-15 MED ORDER — POTASSIUM CHLORIDE 10 MEQ/100ML IV SOLN
10.0000 meq | INTRAVENOUS | Status: AC
  Administered 2021-08-15 (×3): 10 meq via INTRAVENOUS
  Filled 2021-08-15 (×3): qty 100

## 2021-08-15 MED ORDER — PANTOPRAZOLE SODIUM 40 MG PO TBEC
40.0000 mg | DELAYED_RELEASE_TABLET | Freq: Two times a day (BID) | ORAL | Status: DC
  Administered 2021-08-15 – 2021-08-17 (×5): 40 mg via ORAL
  Filled 2021-08-15 (×5): qty 1

## 2021-08-15 NOTE — H&P (Addendum)
History and Physical    Roy Ross FXT:[id number] DOB: [birth year] DOA: 08/14/2021  PCP: Georgann Housekeeper, MD  Patient coming from: [facility]  I have personally briefly reviewed patient's old medical records in [facility]  Chief Complaint:  abdominal pain  x 7 days  HPI: Roy Ross is a 51 y.o. male with medical history significant of HTN, anxiety , gerd, possible myasthenia gravis on steroids, Chron's disease diagnosed one year ago non-complaint with his medications, presents to [facility] with 7 days of mild abdominal pain with nausea and1 day of localized abdominal pain to LLQ.  Patient noted also associated chills at home. Patient however denies / chest pain / cough / dysuria or flank pain  or diarrhea. He does however endorse blood in stools that he states he had had for years. He notes no associated dizziness or presyncope.  He denies joint pain ,swelling or rash or oral ulcers.   ED Course:  Vitals:  afeb, bp 128/83, hr 81, rr 14 sat 97%   Labs: Wbc 12.7, hgb 14.6, pmn 9.6,  Na 138, K 3, glu 125, cr 0.84 CT abdomen 1. Focal wall thickening in the distal descending colon with ill-defined anterior diverticulum and pericolonic edema/inflammation. Imaging features consistent with diverticulitis. No evidence for perforation. While not definite, 11 mm hypoattenuating focus in the wall of the colon in this region raises the question of a tiny intramural abscess. 2. Wall thickening in the terminal ileum without perienteric edema or inflammation. No findings in the terminal ileum to suggest fibrous or inflammatory stricture. UA: negative tx CTX 2 mg,metronidazole  Review of Systems: As per HPI otherwise 10 point review of systems negative.   Past Medical History:  Diagnosis Date   Abscess    pectoralis major   Hypertension     History reviewed. No pertinent surgical history.   reports that he quit smoking about 14 years ago. His smoking use  included cigarettes. He has never used smokeless tobacco. He reports that he does not drink alcohol and does not use drugs.  Allergies  Allergen Reactions   Penicillins     Pt was told as child    Family History  Problem Relation Age of Onset   Hypertension Father    Heart disease Father        pacemaker   Diabetes Father    Cancer Father        bladder    Prior to Admission medications   Medication Sig Start Date End Date Taking? Authorizing Provider  amLODipine (NORVASC) 5 MG tablet Daily. 08/14/10   [provider]  citalopram (CELEXA) 40 MG tablet 1 tablet    [provider]  losartan-hydrochlorothiazide (HYZAAR) 50-12.5 MG tablet Take 1 tablet by mouth every morning. 10/09/20   [provider]  Mesalamine 800 MG TBEC Take 2 tablets by mouth 3 (three) times daily. 10/29/20   [provider]  pantoprazole (PROTONIX) 40 MG tablet Take 40 mg by mouth 2 (two) times daily. 08/01/20   [provider]    Physical Exam: Vitals:   08/14/21 1603 08/14/21 1730 08/14/21 2100 08/14/21 2230  BP: 128/83 110/76 119/86 113/71  Pulse: 81 61 (!) 53 (!) 56  Resp: 14 16 16 16   Temp: 98.5 F (36.9 C)   (!) 97.5 F (36.4 C)  TempSrc: Oral   Oral  SpO2: 97% 96% 99% 96%  Weight:      Height:        Vitals:  08/14/21 1603 08/14/21 1730 08/14/21 2100 08/14/21 2230  BP: 128/83 110/76 119/86 113/71  Pulse: 81 61 (!) 53 (!) 56  Resp: 14 16 16 16   Temp: 98.5 F (36.9 C)   (!) 97.5 F (36.4 C)  TempSrc: Oral   Oral  SpO2: 97% 96% 99% 96%  Weight:      Height:       Constitutional: NAD, calm, comfortable Eyes: PERRL, lids and conjunctivae normal ENMT: Mucous membranes are moist. Posterior pharynx clear of any exudate or lesions.Normal dentition.  Neck: normal, supple, no masses, no thyromegaly Respiratory: clear to auscultation bilaterally, no wheezing, no crackles. Normal respiratory effort. No accessory muscle use.  Cardiovascular: Regular rate  and rhythm, no murmurs / rubs / gallops. No extremity edema. 2+ pedal pulses. No carotid bruits.  Abdomen:  LLQ pain tenderness no rebound, no masses palpated. No hepatosplenomegaly. Bowel sounds positive.  Musculoskeletal: no clubbing / cyanosis. No joint deformity upper and lower extremities. Good ROM, no contractures. Normal muscle tone.  Skin: no rashes, lesions, ulcers. No induration Neurologic: CN 2-12 grossly intact. Sensation intact, DTR normal. Strength 5/5 in all 4.  Psychiatric: Normal judgment and insight. Alert and oriented x 3. Normal mood.    Labs on Admission: I have personally reviewed following labs and imaging studies  CBC: Recent Labs  Lab 08/14/21 1623  WBC 12.7*  NEUTROABS 9.6*  HGB 14.6  HCT 42.7  MCV 87.3  PLT 284   Basic Metabolic Panel: Recent Labs  Lab 08/14/21 1623  NA 138  K 3.0*  CL 101  CO2 27  GLUCOSE 125*  BUN 6  CREATININE 0.84  CALCIUM 9.4   GFR: Estimated Creatinine Clearance: 106.1 mL/min (by C-G formula based on SCr of 0.84 mg/dL). Liver Function Tests: Recent Labs  Lab 08/14/21 1623  AST 11*  ALT 11  ALKPHOS 69  BILITOT 2.0*  PROT 7.5  ALBUMIN 4.3   Recent Labs  Lab 08/14/21 1623  LIPASE 31   No results for input(s): "AMMONIA" in the last 168 hours. Coagulation Profile: No results for input(s): "INR", "PROTIME" in the last 168 hours. Cardiac Enzymes: No results for input(s): "CKTOTAL", "CKMB", "CKMBINDEX", "TROPONINI" in the last 168 hours. BNP (last 3 results) No results for input(s): "PROBNP" in the last 8760 hours. HbA1C: No results for input(s): "HGBA1C" in the last 72 hours. CBG: No results for input(s): "GLUCAP" in the last 168 hours. Lipid Profile: No results for input(s): "CHOL", "HDL", "LDLCALC", "TRIG", "CHOLHDL", "LDLDIRECT" in the last 72 hours. Thyroid Function Tests: No results for input(s): "TSH", "T4TOTAL", "FREET4", "T3FREE", "THYROIDAB" in the last 72 hours. Anemia Panel: No results for  input(s): "VITAMINB12", "FOLATE", "FERRITIN", "TIBC", "IRON", "RETICCTPCT" in the last 72 hours. Urine analysis:    Component Value Date/Time   COLORURINE YELLOW 08/14/2021 2000   APPEARANCEUR CLEAR 08/14/2021 2000   LABSPEC 1.041 (H) 08/14/2021 2000   PHURINE 6.5 08/14/2021 2000   GLUCOSEU NEGATIVE 08/14/2021 2000   HGBUR NEGATIVE 08/14/2021 2000   BILIRUBINUR NEGATIVE 08/14/2021 2000   BILIRUBINUR neg 09/01/2012 1850   KETONESUR NEGATIVE 08/14/2021 2000   PROTEINUR NEGATIVE 08/14/2021 2000   UROBILINOGEN 0.2 09/01/2012 1850   NITRITE NEGATIVE 08/14/2021 2000   LEUKOCYTESUR NEGATIVE 08/14/2021 2000    Radiological Exams on Admission: CT ABDOMEN PELVIS W CONTRAST  Result Date: 08/14/2021 CLINICAL DATA:  Left lower quadrant pain with loose stools. Abdominal tenderness. History of Crohn disease. Weight loss. EXAM: CT ABDOMEN AND PELVIS WITH CONTRAST TECHNIQUE: Multidetector CT imaging of  the abdomen and pelvis was performed using the standard protocol following bolus administration of intravenous contrast. RADIATION DOSE REDUCTION: This exam was performed according to the departmental dose-optimization program which includes automated exposure control, adjustment of the mA and/or kV according to patient size and/or use of iterative reconstruction technique. CONTRAST:  ISOVUE-300 IOPAMIDOL (ISOVUE-300) INJECTION 61% COMPARISON:  None Available. FINDINGS: Lower chest: Unremarkable. Hepatobiliary: 6 mm hypoattenuating lesion in the inferior tip of the right liver is too small to characterize but likely benign. There is no evidence for gallstones, gallbladder wall thickening, or pericholecystic fluid. No intrahepatic or extrahepatic biliary dilation. Pancreas: No focal mass lesion. No dilatation of the main duct. No intraparenchymal cyst. No peripancreatic edema. Spleen: No splenomegaly. No focal mass lesion. Adrenals/Urinary Tract: No adrenal nodule or mass. Kidneys unremarkable. No evidence  for hydroureter. The urinary bladder appears normal for the degree of distention. Stomach/Bowel: Stomach is unremarkable. No gastric wall thickening. No evidence of outlet obstruction. Duodenum is normally positioned as is the ligament of Treitz. No small bowel wall thickening. No small bowel dilatation. Wall thickening in the terminal ileum evident without perienteric edema or inflammation. No findings in the terminal ileum to suggest fibrous or inflammatory stricture. The appendix is normal. Focal wall thickening of the distal descending colon is identified within ill-defined anterior diverticulum (57/4) and pericolonic edema/inflammation. No evidence for extraluminal gas. 11 mm hypoattenuating focus in the adjacent colonic wall may be a tiny intramural abscess. Vascular/Lymphatic: No abdominal aortic aneurysm. There is no gastrohepatic or hepatoduodenal ligament lymphadenopathy. No retroperitoneal or mesenteric lymphadenopathy. No pelvic sidewall lymphadenopathy. Reproductive: The prostate gland and seminal vesicles are unremarkable. Other: No intraperitoneal free fluid. Musculoskeletal: No worrisome lytic or sclerotic osseous abnormality. IMPRESSION: 1. Focal wall thickening in the distal descending colon with ill-defined anterior diverticulum and pericolonic edema/inflammation. Imaging features consistent with diverticulitis. No evidence for perforation. While not definite, 11 mm hypoattenuating focus in the wall of the colon in this region raises the question of a tiny intramural abscess. 2. Wall thickening in the terminal ileum without perienteric edema or inflammation. No findings in the terminal ileum to suggest fibrous or inflammatory stricture. Electronically Signed   By: Kennith Center M.D.   On: 08/14/2021 15:00    EKG: Independently reviewed.   Assessment/Plan  Acute diverticulitis -ct scan noting ? Of tiny intramural abscess -admit to tele  -ctx/metronidazole  -supportive care with  antiemetic, pain medications , ivfs  -clear liquids as patient tolerates -cycle lactic / inflammatory markers/ check blood cultures   Hx of Chron's -note daily blood in stools / although hgb stable at 14 -notes other than this past weak no abdominal pain -resume mesalamine  -consider gi consult in am  HTN -stable  -resume in am as bp can tolerate  Anxiety  -continue home regimen    Possible Myasthenia gravis  -continue home steroids  -no acute worsening of sxs.  -monitor on continuous pulse ox  -avoid sedative medications as able     Hypokalemia -replete prn   DVT prophylaxis: heparin Code Status: Full Family Communication: none at beside Disposition Plan: patient  expected to be admitted greater than 2 midnights  Consults called: n/a Admission status: med tele   Lurline Del MD [facility]  If 7PM-7AM, please contact night-coverage [email or web address] Password [email or web address]  08/15/2021, 1:08 AM

## 2021-08-15 NOTE — Consult Note (Addendum)
Reason for Consult: abdominal pain, diverticulitis  Referring Physician: Dr. Skip Mayer, TRH  Roy Ross is an 51 y.o. male.   HPI: Patient is a 51 year old male who presented to the emergency department with 2-day history of abdominal pain localized to the left mid abdomen.  Patient has a history of Crohn's disease diagnosed approximately 1 year ago involving the terminal ileum.  He is followed by Dr. Levora Angel.  Patient has also recently been diagnosed with suspected myasthenia gravis and has been on prednisone.  He is followed at [facility].  Patient presented with left lower quadrant abdominal pain.  He noted chills.  Laboratory studies showed a white blood cell count of 12.7K initially.  CT scan of the abdomen and pelvis showed inflammation in the distal descending colon with associated diverticulum consistent with diverticulitis.  Also noted was a small 11 mm collection in the wall of the colon suspicious for a small mural abscess.  Patient was admitted to the medical service and started on intravenous antibiotics.  Surgical consultation was requested.  Past Medical History:  Diagnosis Date   Abscess    pectoralis major   Hypertension     History reviewed. No pertinent surgical history.  Family History  Problem Relation Age of Onset   Hypertension Father    Heart disease Father        pacemaker   Diabetes Father    Cancer Father        bladder    Social History:  reports that he quit smoking about 14 years ago. His smoking use included cigarettes. He has never used smokeless tobacco. He reports that he does not drink alcohol and does not use drugs.  Allergies:  Allergies  Allergen Reactions   Penicillins Rash and Other (See Comments)    Childhood reaction    Medications: I have reviewed the patient's current medications.  Results for orders placed or performed during the hospital encounter of 08/14/21 (from the past 48 hour(s))  CBC with  Differential     Status: Abnormal   Collection Time: 08/14/21  4:23 PM  Result Value Ref Range   WBC 12.7 (H) 4.0 - 10.5 K/uL   RBC 4.89 4.22 - 5.81 MIL/uL   Hemoglobin 14.6 13.0 - 17.0 g/dL   HCT 54.6 56.8 - 12.7 %   MCV 87.3 80.0 - 100.0 fL   MCH 29.9 26.0 - 34.0 pg   MCHC 34.2 30.0 - 36.0 g/dL   RDW 51.7 00.1 - 74.9 %   Platelets 284 150 - 400 K/uL   nRBC 0.0 0.0 - 0.2 %   Neutrophils Relative % 76 %   Neutro Abs 9.6 (H) 1.7 - 7.7 K/uL   Lymphocytes Relative 17 %   Lymphs Abs 2.2 0.7 - 4.0 K/uL   Monocytes Relative 7 %   Monocytes Absolute 0.9 0.1 - 1.0 K/uL   Eosinophils Relative 0 %   Eosinophils Absolute 0.0 0.0 - 0.5 K/uL   Basophils Relative 0 %   Basophils Absolute 0.1 0.0 - 0.1 K/uL   Immature Granulocytes 0 %   Abs Immature Granulocytes 0.04 0.00 - 0.07 K/uL    Comment: Performed at [facility], [address], [place], [place] [address]  Comprehensive metabolic panel     Status: Abnormal   Collection Time: 08/14/21  4:23 PM  Result Value Ref Range   Sodium 138 135 - 145 mmol/L   Potassium 3.0 (L) 3.5 - 5.1 mmol/L  Chloride 101 98 - 111 mmol/L   CO2 27 22 - 32 mmol/L   Glucose, Bld 125 (H) 70 - 99 mg/dL    Comment: Glucose reference range applies only to samples taken after fasting for at least 8 hours.   BUN 6 6 - 20 mg/dL   Creatinine, Ser 7.67 0.61 - 1.24 mg/dL   Calcium 9.4 8.9 - 20.9 mg/dL   Total Protein 7.5 6.5 - 8.1 g/dL   Albumin 4.3 3.5 - 5.0 g/dL   AST 11 (L) 15 - 41 U/L   ALT 11 0 - 44 U/L   Alkaline Phosphatase 69 38 - 126 U/L   Total Bilirubin 2.0 (H) 0.3 - 1.2 mg/dL   GFR, Estimated >47 >09 mL/min    Comment: (NOTE) Calculated using the CKD-EPI Creatinine Equation (2021)    Anion gap 10 5 - 15    Comment: Performed at [facility], [address], [place], [place] [address]  Lipase, blood     Status: None   Collection Time: 08/14/21  4:23 PM  Result Value Ref Range   Lipase 31 11 - 51 U/L     Comment: Performed at [facility], [address], [place], [place] [address]  Urinalysis, Routine w reflex microscopic Urine, Clean Catch     Status: Abnormal   Collection Time: 08/14/21  8:00 PM  Result Value Ref Range   Color, Urine YELLOW YELLOW   APPearance CLEAR CLEAR   Specific Gravity, Urine 1.041 (H) 1.005 - 1.030   pH 6.5 5.0 - 8.0   Glucose, UA NEGATIVE NEGATIVE mg/dL   Hgb urine dipstick NEGATIVE NEGATIVE   Bilirubin Urine NEGATIVE NEGATIVE   Ketones, ur NEGATIVE NEGATIVE mg/dL   Protein, ur NEGATIVE NEGATIVE mg/dL   Nitrite NEGATIVE NEGATIVE   Leukocytes,Ua NEGATIVE NEGATIVE    Comment: Performed at [facility], [address], [place], [place] [address]  CBC     Status: None   Collection Time: 08/15/21  1:43 AM  Result Value Ref Range   WBC 9.2 4.0 - 10.5 K/uL   RBC 4.62 4.22 - 5.81 MIL/uL   Hemoglobin 13.8 13.0 - 17.0 g/dL   HCT 03.5 46.5 - 68.1 %   MCV 89.6 80.0 - 100.0 fL   MCH 29.9 26.0 - 34.0 pg   MCHC 33.3 30.0 - 36.0 g/dL   RDW 27.5 17.0 - 01.7 %   Platelets 246 150 - 400 K/uL   nRBC 0.0 0.0 - 0.2 %    Comment: Performed at [facility], [address] [place], [place] [address]  Basic metabolic panel     Status: Abnormal   Collection Time: 08/15/21  1:43 AM  Result Value Ref Range   Sodium 138 135 - 145 mmol/L   Potassium 3.3 (L) 3.5 - 5.1 mmol/L   Chloride 104 98 - 111 mmol/L   CO2 29 22 - 32 mmol/L   Glucose, Bld 94 70 - 99 mg/dL    Comment: Glucose reference range applies only to samples taken after fasting for at least 8 hours.   BUN 10 6 - 20 mg/dL   Creatinine, Ser 6.75 0.61 - 1.24 mg/dL   Calcium 8.6 (L) 8.9 - 10.3 mg/dL   GFR, Estimated >91 >63 mL/min    Comment: (NOTE) Calculated using the CKD-EPI Creatinine Equation (2021)    Anion gap 5 5 - 15    Comment: Performed at [facility], [address] [place], [place]  [address]  TSH     Status: None    Collection Time: 08/15/21  1:43 AM  Result Value Ref Range   TSH 3.497 0.350 - 4.500 uIU/mL    Comment: Performed by a 3rd Generation assay with a functional sensitivity of <=0.01 uIU/mL. Performed at [facility], [address] [place] [address], [place], [place] [address]   Procalcitonin - Baseline     Status: None   Collection Time: 08/15/21  1:43 AM  Result Value Ref Range   Procalcitonin <0.10 ng/mL    Comment:        Interpretation: PCT (Procalcitonin) <= 0.5 ng/mL: Systemic infection (sepsis) is not likely. Local bacterial infection is possible. (NOTE)       Sepsis PCT Algorithm           Lower Respiratory Tract                                      Infection PCT Algorithm    ----------------------------     ----------------------------         PCT < 0.25 ng/mL                PCT < 0.10 ng/mL          Strongly encourage             Strongly discourage   discontinuation of antibiotics    initiation of antibiotics    ----------------------------     -----------------------------       PCT 0.25 - 0.50 ng/mL            PCT 0.10 - 0.25 ng/mL               OR       >80% decrease in PCT            Discourage initiation of                                            antibiotics      Encourage discontinuation           of antibiotics    ----------------------------     -----------------------------         PCT >= 0.50 ng/mL              PCT 0.26 - 0.50 ng/mL               AND        <80% decrease in PCT             Encourage initiation of                                             antibiotics       Encourage continuation           of antibiotics    ----------------------------     -----------------------------        PCT >= 0.50 ng/mL                  PCT > 0.50 ng/mL               AND  increase in PCT                  Strongly encourage                                      initiation of antibiotics    Strongly encourage escalation           of antibiotics                                      -----------------------------                                           PCT <= 0.25 ng/mL                                                 OR                                        > 80% decrease in PCT                                      Discontinue / Do not initiate                                             antibiotics  Performed at [facility], [address], [place], [place] [address]   C-reactive protein     Status: Abnormal   Collection Time: 08/15/21  1:43 AM  Result Value Ref Range   CRP 14.0 (H) <1.0 mg/dL    Comment: Performed at [facility], [address], [place], [place] [address]  Lactic acid, plasma     Status: None   Collection Time: 08/15/21  1:43 AM  Result Value Ref Range   Lactic Acid, Venous 0.9 0.5 - 1.9 mmol/L    Comment: Performed at [facility], [address], [place], [place] [address]  Magnesium     Status: None   Collection Time: 08/15/21  1:43 AM  Result Value Ref Range   Magnesium 2.3 1.7 - 2.4 mg/dL    Comment: Performed at [facility], [address] [place], [place] [address]  CBC     Status: None   Collection Time: 08/15/21  5:22 AM  Result Value Ref Range   WBC 8.7 4.0 - 10.5 K/uL   RBC 4.64 4.22 - 5.81 MIL/uL   Hemoglobin 13.6 13.0 - 17.0 g/dL   HCT 29.5 62.1 - 30.8 %   MCV 90.5 80.0 - 100.0 fL   MCH 29.3 26.0 - 34.0 pg   MCHC 32.4 30.0 - 36.0 g/dL   RDW 65.7 84.6 - 96.2 %   Platelets 238 150 - 400 K/uL   nRBC 0.0 0.0 -  0.2 %    Comment: Performed at [facility], [address] [place], [place] [address]  Comprehensive metabolic panel     Status: Abnormal   Collection Time: 08/15/21  5:22 AM  Result Value Ref Range   Sodium 142 135 - 145 mmol/L   Potassium 3.7 3.5 - 5.1 mmol/L   Chloride 104 98 - 111 mmol/L   CO2 29 22 - 32 mmol/L   Glucose, Bld 87 70 - 99 mg/dL    Comment: Glucose reference range applies only to  samples taken after fasting for at least 8 hours.   BUN 11 6 - 20 mg/dL   Creatinine, Ser 6.76 0.61 - 1.24 mg/dL   Calcium 8.8 (L) 8.9 - 10.3 mg/dL   Total Protein 7.0 6.5 - 8.1 g/dL   Albumin 3.4 (L) 3.5 - 5.0 g/dL   AST 12 (L) 15 - 41 U/L   ALT 13 0 - 44 U/L   Alkaline Phosphatase 60 38 - 126 U/L   Total Bilirubin 1.3 (H) 0.3 - 1.2 mg/dL   GFR, Estimated >19 >50 mL/min    Comment: (NOTE) Calculated using the CKD-EPI Creatinine Equation (2021)    Anion gap 9 5 - 15    Comment: Performed at [facility], [address], [place], [place] [address]  Lactic acid, plasma     Status: None   Collection Time: 08/15/21  5:22 AM  Result Value Ref Range   Lactic Acid, Venous 0.8 0.5 - 1.9 mmol/L    Comment: Performed at [facility], [address], [place], [place] [address]  Glucose, capillary     Status: None   Collection Time: 08/15/21  8:10 AM  Result Value Ref Range   Glucose-Capillary 83 70 - 99 mg/dL    Comment: Glucose reference range applies only to samples taken after fasting for at least 8 hours.    CT ABDOMEN PELVIS W CONTRAST  Result Date: 08/14/2021 CLINICAL DATA:  Left lower quadrant pain with loose stools. Abdominal tenderness. History of Crohn disease. Weight loss. EXAM: CT ABDOMEN AND PELVIS WITH CONTRAST TECHNIQUE: Multidetector CT imaging of the abdomen and pelvis was performed using the standard protocol following bolus administration of intravenous contrast. RADIATION DOSE REDUCTION: This exam was performed according to the departmental dose-optimization program which includes automated exposure control, adjustment of the mA and/or kV according to patient size and/or use of iterative reconstruction technique. CONTRAST:  ISOVUE-300 IOPAMIDOL (ISOVUE-300) INJECTION 61% COMPARISON:  None Available. FINDINGS: Lower chest: Unremarkable. Hepatobiliary: 6 mm hypoattenuating lesion in the inferior tip of the right liver is too small to  characterize but likely benign. There is no evidence for gallstones, gallbladder wall thickening, or pericholecystic fluid. No intrahepatic or extrahepatic biliary dilation. Pancreas: No focal mass lesion. No dilatation of the main duct. No intraparenchymal cyst. No peripancreatic edema. Spleen: No splenomegaly. No focal mass lesion. Adrenals/Urinary Tract: No adrenal nodule or mass. Kidneys unremarkable. No evidence for hydroureter. The urinary bladder appears normal for the degree of distention. Stomach/Bowel: Stomach is unremarkable. No gastric wall thickening. No evidence of outlet obstruction. Duodenum is normally positioned as is the ligament of Treitz. No small bowel wall thickening. No small bowel dilatation. Wall thickening in the terminal ileum evident without perienteric edema or inflammation. No findings in the terminal ileum to suggest fibrous or inflammatory stricture. The appendix is normal. Focal wall thickening of the distal descending colon is identified within ill-defined anterior diverticulum (57/4) and pericolonic edema/inflammation.  No evidence for extraluminal gas. 11 mm hypoattenuating focus in the adjacent colonic wall may be a tiny intramural abscess. Vascular/Lymphatic: No abdominal aortic aneurysm. There is no gastrohepatic or hepatoduodenal ligament lymphadenopathy. No retroperitoneal or mesenteric lymphadenopathy. No pelvic sidewall lymphadenopathy. Reproductive: The prostate gland and seminal vesicles are unremarkable. Other: No intraperitoneal free fluid. Musculoskeletal: No worrisome lytic or sclerotic osseous abnormality. IMPRESSION: 1. Focal wall thickening in the distal descending colon with ill-defined anterior diverticulum and pericolonic edema/inflammation. Imaging features consistent with diverticulitis. No evidence for perforation. While not definite, 11 mm hypoattenuating focus in the wall of the colon in this region raises the question of a tiny intramural abscess. 2. Wall  thickening in the terminal ileum without perienteric edema or inflammation. No findings in the terminal ileum to suggest fibrous or inflammatory stricture. Electronically Signed   By: Kennith Center M.D.   On: 08/14/2021 15:00    Review of Systems  Constitutional:  Positive for appetite change, fatigue and unexpected weight change.  HENT:  Positive for trouble swallowing.   Eyes: Negative.   Respiratory: Negative.    Cardiovascular: Negative.   Gastrointestinal:  Positive for abdominal pain.  Endocrine: Negative.   Genitourinary: Negative.   Musculoskeletal: Negative.   Skin: Negative.   Allergic/Immunologic: Negative.   Neurological:  Positive for weakness.  Hematological: Negative.   Psychiatric/Behavioral: Negative.      Physical Exam  Blood pressure (!) 105/54, pulse (!) 50, temperature 97.7 F (36.5 C), temperature source Oral, resp. rate 16, height 5\' 6"  (1.676 m), weight 80.8 kg, SpO2 100 %.  CONSTITUTIONAL: no acute distress; conversant; no obvious deformities  EYES: Conjunctiva clear and moist; pupils equal bilaterally  NECK: trachea midline; no thyroid nodularity  LUNGS: respiratory effort normal & unlabored; no wheeze; no rales  CV: rate and rhythm regular; no significant murmur; no edema bilat lower extremities  GI: abdomen soft without distention; active bowel sounds on auscultation; tenderness to palpation in the left mid abdomen and left lower quadrant without guarding; no palpable mass  MSK: normal range of motion of extremities; no clubbing; no cyanosis  PSYCH: appropriate affect for situation; alert and oriented to person, place, & time  LYMPHATIC: no palpable cervical lymphadenopathy    Assessment/Plan:  Acute diverticulitis with small mural abscess  OK to have clear liquid diet  IV abx's per medical service  No plans for acute operative intervention  Agree with GI consultation - hx of Crohn's disease  Hx of Crohn's disease Ongoing  evaluation for myasthenia gravis  Will follow with you.  , MD [facility] practice Office: [phone]   [phone] 08/15/2021, 10:19 AM

## 2021-08-15 NOTE — Progress Notes (Addendum)
   Roy Ross  WUJ:[id number] DOB: 23-Jan-1971 DOA: 08/14/2021 PCP: Georgann Housekeeper, MD    Brief Narrative:  51 year old with a history of HTN, anxiety, GERD, Crohn's disease, and myasthenia gravis who presented with a 7-day history of abdominal pain and nausea.  CT abdomen and pelvis revealed focal wall thickening in the distal descending colon with an ill-defined anterior diverticulum.  Pericolonic edema/inflammation consistent with diverticulitis and a possible 11 mm focus in the wall of the colon in this region suggestive of a small intramural abscess.  Consultants:  General Surgery  Goals of Care:  Code Status: Ross Code   DVT prophylaxis: Subcu heparin  Interim Hx: Afebrile.  Vital signs stable.  No acute events reported since admission.  Patient was seen for follow-up visit, and was resting comfortably when I entered the room.  Assessment & Plan:  Acute diverticulitis with possible abscess Continue empiric antibiotics - supportive care  Crohn's disease Reported a hx of chronic daily hematochezia - hemoglobin stable -holding usual steroid dose in setting of acute infection  Hypokalemia Corrected with supplementation -magnesium is normal  HTN  Anxiety disorder  Myasthenia gravis  Family Communication: No family present at time of visit Disposition: From home -anticipate eventual discharge home  Objective: Blood pressure (!) 105/54, pulse (!) 50, temperature 97.7 F (36.5 C), temperature source Oral, resp. rate 16, height 5\' 6"  (1.676 m), weight 80.8 kg, SpO2 100 %.  Intake/Output Summary (Last 24 hours) at 08/15/2021 0956 Last data filed at 08/15/2021 0900 Gross per 24 hour  Intake 345.91 ml  Output 650 ml  Net -304.09 ml   Filed Weights   08/14/21 1601 08/15/21 0300  Weight: 82.6 kg 80.8 kg    Examination: Patient was examined by one of my partners earlier today  CBC: Recent Labs  Lab 08/14/21 1623 08/15/21 0143 08/15/21 0522  WBC 12.7* 9.2 8.7   NEUTROABS 9.6*  --   --   HGB 14.6 13.8 13.6  HCT 42.7 41.4 42.0  MCV 87.3 89.6 90.5  PLT 284 246 238   Basic Metabolic Panel: Recent Labs  Lab 08/14/21 1623 08/15/21 0143 08/15/21 0522  NA 138 138 142  K 3.0* 3.3* 3.7  CL 101 104 104  CO2 27 29 29   GLUCOSE 125* 94 87  BUN 6 10 11   CREATININE 0.84 0.91 0.87  CALCIUM 9.4 8.6* 8.8*  MG  --  2.3  --    GFR: Estimated Creatinine Clearance: 101.4 mL/min (by C-G formula based on SCr of 0.87 mg/dL).  Liver Function Tests: Recent Labs  Lab 08/14/21 1623 08/15/21 0522  AST 11* 12*  ALT 11 13  ALKPHOS 69 60  BILITOT 2.0* 1.3*  PROT 7.5 7.0  ALBUMIN 4.3 3.4*   Recent Labs  Lab 08/14/21 1623  LIPASE 31    Scheduled Meds:  citalopram  40 mg Oral Daily   heparin  5,000 Units Subcutaneous Q8H   Mesalamine  1,600 mg Oral TID   pantoprazole  40 mg Oral BID   Continuous Infusions:  sodium chloride Stopped (08/14/21 1859)   sodium chloride 75 mL/hr at 08/15/21 0230   cefTRIAXone (ROCEPHIN)  IV     And   metronidazole 500 mg (08/15/21 0714)     LOS: 1 day   10/15/21, MD [facility] Office  [phone] Pager - Text Page per 10/16/21  If 7PM-7AM, please contact night-coverage per Amion 08/15/2021, 9:56 AM

## 2021-08-16 DIAGNOSIS — K5792 Diverticulitis of intestine, part unspecified, without perforation or abscess without bleeding: Secondary | ICD-10-CM | POA: Diagnosis not present

## 2021-08-16 LAB — CBC
HCT: 38 % — ABNORMAL LOW (ref 39.0–52.0)
Hemoglobin: 12.4 g/dL — ABNORMAL LOW (ref 13.0–17.0)
MCH: 30 pg (ref 26.0–34.0)
MCHC: 32.6 g/dL (ref 30.0–36.0)
MCV: 92 fL (ref 80.0–100.0)
Platelets: 242 10*3/uL (ref 150–400)
RBC: 4.13 MIL/uL — ABNORMAL LOW (ref 4.22–5.81)
RDW: 14.1 % (ref 11.5–15.5)
WBC: 8.3 10*3/uL (ref 4.0–10.5)
nRBC: 0 % (ref 0.0–0.2)

## 2021-08-16 LAB — COMPREHENSIVE METABOLIC PANEL
ALT: 11 U/L (ref 0–44)
AST: 12 U/L — ABNORMAL LOW (ref 15–41)
Albumin: 3 g/dL — ABNORMAL LOW (ref 3.5–5.0)
Alkaline Phosphatase: 52 U/L (ref 38–126)
Anion gap: 5 (ref 5–15)
BUN: 10 mg/dL (ref 6–20)
CO2: 27 mmol/L (ref 22–32)
Calcium: 8.6 mg/dL — ABNORMAL LOW (ref 8.9–10.3)
Chloride: 111 mmol/L (ref 98–111)
Creatinine, Ser: 0.92 mg/dL (ref 0.61–1.24)
GFR, Estimated: >60 mL/min (ref >60)
Glucose, Bld: 101 mg/dL — ABNORMAL HIGH (ref 70–99)
Potassium: 4 mmol/L (ref 3.5–5.1)
Sodium: 143 mmol/L (ref 135–145)
Total Bilirubin: 0.6 mg/dL (ref 0.3–1.2)
Total Protein: 6.2 g/dL — ABNORMAL LOW (ref 6.5–8.1)

## 2021-08-16 LAB — MAGNESIUM: Magnesium: 2.4 mg/dL (ref 1.7–2.4)

## 2021-08-16 LAB — GLUCOSE, CAPILLARY: Glucose-Capillary: 116 mg/dL — ABNORMAL HIGH (ref 70–99)

## 2021-08-16 NOTE — Progress Notes (Signed)
Assessment & Plan: HD#3 - Acute diverticulitis with small mural abscess             advance to full liquid diet             IV abx's per medical service             No plans for acute operative intervention             Agree with GI consultation - hx of Crohn's disease   Hx of Crohn's disease Ongoing evaluation for myasthenia gravis - holding steroids at this time   Will follow with you.        Darnell Level, MD [facility] Surgery A [facility] practice Office: [phone]        Chief Complaint: Acute diverticulitis with mural abscess  Subjective: Patient in bed, no pain.  Tolerating clear liquid diet.  Objective: Vital signs in last 24 hours: Temp:  [97.5 F (36.4 C)-98.8 F (37.1 C)] 97.7 F (36.5 C) (07/09 0636) Pulse Rate:  [45-63] 56 (07/09 0636) Resp:  [16-18] 18 (07/09 0636) BP: (96-127)/(63-71) 96/68 (07/09 0636) SpO2:  [97 %-99 %] 99 % (07/09 0636) Last BM Date : 08/14/21  Intake/Output from previous day: 07/08 0701 - 07/09 0700 In: 1362.3 [P.O.:240; I.V.:797; IV Piggyback:325.3] Out: 1200 [Urine:1200] Intake/Output this shift: No intake/output data recorded.  Physical Exam: HEENT - sclerae clear, mucous membranes moist Neck - soft Abdomen - soft without distension; active BS present; mild tenderness LLQ, no mass Ext - no edema, non-tender Neuro - alert & oriented, no focal deficits  Lab Results:  Recent Labs    08/15/21 0522 08/16/21 0457  WBC 8.7 8.3  HGB 13.6 12.4*  HCT 42.0 38.0*  PLT 238 242   BMET Recent Labs    08/15/21 0522 08/16/21 0457  NA 142 143  K 3.7 4.0  CL 104 111  CO2 29 27  GLUCOSE 87 101*  BUN 11 10  CREATININE 0.87 0.92  CALCIUM 8.8* 8.6*   PT/INR No results for input(s): "LABPROT", "INR" in the last 72 hours. Comprehensive Metabolic Panel:    Component Value Date/Time   NA 143 08/16/2021 0457   NA 142 08/15/2021 0522   K 4.0 08/16/2021 0457   K 3.7 08/15/2021 0522   CL 111 08/16/2021 0457   CL  104 08/15/2021 0522   CO2 27 08/16/2021 0457   CO2 29 08/15/2021 0522   BUN 10 08/16/2021 0457   BUN 11 08/15/2021 0522   CREATININE 0.92 08/16/2021 0457   CREATININE 0.87 08/15/2021 0522   GLUCOSE 101 (H) 08/16/2021 0457   GLUCOSE 87 08/15/2021 0522   CALCIUM 8.6 (L) 08/16/2021 0457   CALCIUM 8.8 (L) 08/15/2021 0522   AST 12 (L) 08/16/2021 0457   AST 12 (L) 08/15/2021 0522   ALT 11 08/16/2021 0457   ALT 13 08/15/2021 0522   ALKPHOS 52 08/16/2021 0457   ALKPHOS 60 08/15/2021 0522   BILITOT 0.6 08/16/2021 0457   BILITOT 1.3 (H) 08/15/2021 0522   PROT 6.2 (L) 08/16/2021 0457   PROT 7.0 08/15/2021 0522   ALBUMIN 3.0 (L) 08/16/2021 0457   ALBUMIN 3.4 (L) 08/15/2021 0522    Studies/Results: CT ABDOMEN PELVIS W CONTRAST  Result Date: 08/14/2021 CLINICAL DATA:  Left lower quadrant pain with loose stools. Abdominal tenderness. History of Crohn disease. Weight loss. EXAM: CT ABDOMEN AND PELVIS WITH CONTRAST TECHNIQUE: Multidetector CT imaging of the abdomen and pelvis was performed using the standard protocol  following bolus administration of intravenous contrast. RADIATION DOSE REDUCTION: This exam was performed according to the departmental dose-optimization program which includes automated exposure control, adjustment of the mA and/or kV according to patient size and/or use of iterative reconstruction technique. CONTRAST:  ISOVUE-300 IOPAMIDOL (ISOVUE-300) INJECTION 61% COMPARISON:  None Available. FINDINGS: Lower chest: Unremarkable. Hepatobiliary: 6 mm hypoattenuating lesion in the inferior tip of the right liver is too small to characterize but likely benign. There is no evidence for gallstones, gallbladder wall thickening, or pericholecystic fluid. No intrahepatic or extrahepatic biliary dilation. Pancreas: No focal mass lesion. No dilatation of the main duct. No intraparenchymal cyst. No peripancreatic edema. Spleen: No splenomegaly. No focal mass lesion. Adrenals/Urinary Tract: No  adrenal nodule or mass. Kidneys unremarkable. No evidence for hydroureter. The urinary bladder appears normal for the degree of distention. Stomach/Bowel: Stomach is unremarkable. No gastric wall thickening. No evidence of outlet obstruction. Duodenum is normally positioned as is the ligament of Treitz. No small bowel wall thickening. No small bowel dilatation. Wall thickening in the terminal ileum evident without perienteric edema or inflammation. No findings in the terminal ileum to suggest fibrous or inflammatory stricture. The appendix is normal. Focal wall thickening of the distal descending colon is identified within ill-defined anterior diverticulum (57/4) and pericolonic edema/inflammation. No evidence for extraluminal gas. 11 mm hypoattenuating focus in the adjacent colonic wall may be a tiny intramural abscess. Vascular/Lymphatic: No abdominal aortic aneurysm. There is no gastrohepatic or hepatoduodenal ligament lymphadenopathy. No retroperitoneal or mesenteric lymphadenopathy. No pelvic sidewall lymphadenopathy. Reproductive: The prostate gland and seminal vesicles are unremarkable. Other: No intraperitoneal free fluid. Musculoskeletal: No worrisome lytic or sclerotic osseous abnormality. IMPRESSION: 1. Focal wall thickening in the distal descending colon with ill-defined anterior diverticulum and pericolonic edema/inflammation. Imaging features consistent with diverticulitis. No evidence for perforation. While not definite, 11 mm hypoattenuating focus in the wall of the colon in this region raises the question of a tiny intramural abscess. 2. Wall thickening in the terminal ileum without perienteric edema or inflammation. No findings in the terminal ileum to suggest fibrous or inflammatory stricture. Electronically Signed   By: Kennith Center M.D.   On: 08/14/2021 15:00      Darnell Level 08/16/2021   Patient ID: Roy Ross, male   DOB: 12-Sep-1970, 51 y.o.   MRN: [id number]

## 2021-08-16 NOTE — Progress Notes (Signed)
Roy Ross  KGU:[id number] DOB: 01/05/1971 DOA: 08/14/2021 PCP: Georgann Housekeeper, MD    Brief Narrative:  51 year old with a history of HTN, anxiety, GERD, Crohn's disease, and myasthenia gravis who presented with a 7-day history of abdominal pain and nausea.  CT abdomen and pelvis revealed focal wall thickening in the distal descending colon with an ill-defined anterior diverticulum.  Pericolonic edema/inflammation consistent with diverticulitis and a possible 11 mm focus in the wall of the colon in this region suggestive of a small intramural abscess.  Consultants:  General Surgery  Goals of Care:  Code Status: Full Code   DVT prophylaxis: Subcu heparin  Interim Hx: No acute events reported overnight.  Afebrile.  Some modest bradycardia with heart rates as low as 45-56.  Systolic blood pressure 96-101.  Patient states he is feeling better overall.  Reports poor appetite but no nausea or vomiting.  Assessment & Plan:  Acute diverticulitis with possible abscess Continue empiric antibiotics - supportive care -diet advancement at discretion of general surgery  Crohn's disease Reported a hx of chronic daily hematochezia - hemoglobin stable -holding usual steroid dose in setting of acute infection  Hypokalemia Corrected with supplementation -magnesium is normal  HTN Not an active issue at present with some modest hypotension appreciated -increase volume resuscitation  Anxiety disorder Continue usual home medical therapy  Myasthenia gravis Continue usual home medical therapy   Family Communication: No family present at time of visit Disposition: From home -anticipate eventual discharge home  Objective: Blood pressure 96/68, pulse (!) 56, temperature 97.7 F (36.5 C), temperature source Oral, resp. rate 18, height 5\' 6"  (1.676 m), weight 80.8 kg, SpO2 99 %.  Intake/Output Summary (Last 24 hours) at 08/16/2021 0850 Last data filed at 08/16/2021 0500 Gross per 24 hour   Intake 1362.25 ml  Output 1200 ml  Net 162.25 ml    Filed Weights   08/14/21 1601 08/15/21 0300  Weight: 82.6 kg 80.8 kg    Examination: General: No acute respiratory distress Lungs: Clear to auscultation bilaterally without wheezes or crackles Cardiovascular: Regular rate and rhythm without murmur gallop or rub normal S1 and S2 Abdomen: Soft, bowel sounds positive, no rebound, no appreciable mass Extremities: No significant cyanosis, clubbing, or edema bilateral lower extremities   CBC: Recent Labs  Lab 08/14/21 1623 08/15/21 0143 08/15/21 0522 08/16/21 0457  WBC 12.7* 9.2 8.7 8.3  NEUTROABS 9.6*  --   --   --   HGB 14.6 13.8 13.6 12.4*  HCT 42.7 41.4 42.0 38.0*  MCV 87.3 89.6 90.5 92.0  PLT 284 246 238 242    Basic Metabolic Panel: Recent Labs  Lab 08/15/21 0143 08/15/21 0522 08/16/21 0457  NA 138 142 143  K 3.3* 3.7 4.0  CL 104 104 111  CO2 29 29 27   GLUCOSE 94 87 101*  BUN 10 11 10   CREATININE 0.91 0.87 0.92  CALCIUM 8.6* 8.8* 8.6*  MG 2.3  --  2.4    GFR: Estimated Creatinine Clearance: 95.9 mL/min (by C-G formula based on SCr of 0.92 mg/dL).  Liver Function Tests: Recent Labs  Lab 08/14/21 1623 08/15/21 0522 08/16/21 0457  AST 11* 12* 12*  ALT 11 13 11   ALKPHOS 69 60 52  BILITOT 2.0* 1.3* 0.6  PROT 7.5 7.0 6.2*  ALBUMIN 4.3 3.4* 3.0*    Recent Labs  Lab 08/14/21 1623  LIPASE 31     Scheduled Meds:  citalopram  40 mg Oral Daily   heparin  5,000 Units  Subcutaneous Q8H   Mesalamine  1,600 mg Oral TID   pantoprazole  40 mg Oral BID   pyridostigmine  60 mg Oral TID   Continuous Infusions:  cefTRIAXone (ROCEPHIN)  IV 2 g (08/15/21 1836)   And   metronidazole 500 mg (08/15/21 2008)   dextrose 5 % and 0.9 % NaCl with KCl 20 mEq/L 75 mL/hr at 08/15/21 2115     LOS: 2 days   Lonia Blood, MD [facility] Office  [phone] Pager - Text Page per Loretha Stapler  If 7PM-7AM, please contact night-coverage per  Amion 08/16/2021, 8:50 AM

## 2021-08-17 DIAGNOSIS — K5792 Diverticulitis of intestine, part unspecified, without perforation or abscess without bleeding: Secondary | ICD-10-CM | POA: Diagnosis not present

## 2021-08-17 LAB — GLUCOSE, CAPILLARY: Glucose-Capillary: 100 mg/dL — ABNORMAL HIGH (ref 70–99)

## 2021-08-17 MED ORDER — CIPROFLOXACIN HCL 500 MG PO TABS: 500.0000 mg | ORAL_TABLET | Freq: Two times a day (BID) | ORAL | 0 refills | Status: AC

## 2021-08-17 MED ORDER — METRONIDAZOLE 500 MG PO TABS: 500.0000 mg | ORAL_TABLET | Freq: Two times a day (BID) | ORAL | 0 refills | Status: AC

## 2021-08-17 MED ORDER — PREDNISONE 10 MG PO TABS: 10.0000 mg | ORAL_TABLET | Freq: Every day | ORAL | Status: AC

## 2021-08-17 MED ORDER — CIPROFLOXACIN HCL 500 MG PO TABS
500.0000 mg | ORAL_TABLET | Freq: Two times a day (BID) | ORAL | Status: DC
  Administered 2021-08-17: 500 mg via ORAL
  Filled 2021-08-17: qty 1

## 2021-08-17 MED ORDER — METRONIDAZOLE 500 MG PO TABS
500.0000 mg | ORAL_TABLET | Freq: Two times a day (BID) | ORAL | Status: DC
Start: 2021-08-17 — End: 2021-08-17

## 2021-08-17 MED ORDER — ACETAMINOPHEN 325 MG PO TABS: 650.0000 mg | ORAL_TABLET | Freq: Four times a day (QID) | ORAL | Status: AC | PRN

## 2021-08-17 NOTE — Progress Notes (Signed)
   08/17/21   To Whom it may concern,  Roy Ross was hospitalized at [facility] from 08/14/2021 through 08/17/21.  He has been cleared to return to work on but not before 08/20/2021, with no medical restrictions.    Should you have further questions please feel free to contact our office.  Be advised that no medical information will be divulged without a signed HIPA release from the patient.    Sincerely,  Lonia Blood, MD [facility] Office  [phone]

## 2021-08-17 NOTE — Progress Notes (Signed)
Transition of Care [facility]) Screening Note  Patient Details  Name: Roy Ross Date of Birth: July 30, 1970  Transition of Care [facility]) CM/SW Contact:    Ewing Schlein, LCSW Phone Number: 08/17/2021, 10:10 AM  Transition of Care Department [facility]) has reviewed patient and no TOC needs have been identified at this time. We will continue to monitor patient advancement through interdisciplinary progression rounds. If new patient transition needs arise, please place a TOC consult.

## 2021-08-17 NOTE — Discharge Summary (Signed)
DISCHARGE SUMMARY  Roy Ross  MR#: [id number]  DOB:11-22-1970  Date of Admission: 08/14/2021 Date of Discharge: 08/17/2021  Attending Physician:Olia Hinderliter Silvestre Gunner, MD  Patient's WUJ:[id number], Jerelyn Scott, MD  Consults: Gen Surgery   Disposition: D/C home   Follow-up Appts:  Follow-up Information     Georgann Housekeeper, MD Follow up.   Specialty: Internal Medicine Contact information: [address], [address] [place] [address] [phone]                 Tests Needing Follow-up: -assess control of Crohn's  -consider checking Hgb  -assure no recurrent sx of diverticulitis   Discharge Diagnoses: Acute diverticulitis with possible mural abscess Crohn's disease Hypokalemia HTN Anxiety disorder Myasthenia gravis  Initial presentation: 51 year old with a history of HTN, anxiety, GERD, Crohn's disease, and myasthenia gravis who presented with a 7-day history of abdominal pain and nausea.  CT abdomen and pelvis revealed focal wall thickening in the distal descending colon with an ill-defined anterior diverticulum.  Pericolonic edema/inflammation consistent with diverticulitis and a possible 11 mm focus in the wall of the colon in this region suggestive of a small intramural abscess.  Hospital Course:  Acute diverticulitis with possible mural abscess Continue empiric antibiotics, w/ transition to orals at time of d/c - diet advancement went well, w/ pt tolerating soft foods w/o trouble at time of d/c - cont to hold steroids for a week    Crohn's disease Reported a hx of chronic daily hematochezia - hemoglobin stable -holding usual steroid dose in setting of acute infection - no evidence of acute flare at present - no gross blood loss noted during hospital stay - counseled patient on need to follow-up with GI in outpatient setting - reports he had a colonoscopy less than a year ago   Hypokalemia Corrected with supplementation -magnesium is normal    HTN Not an active issue at present    Anxiety disorder Continue usual home medical therapy   Myasthenia gravis Continue usual home medical therapy -appears well compensated at this time    Allergies as of 08/17/2021       Reactions   Penicillins Rash, Other (See Comments)   Childhood reaction        Medication List     STOP taking these medications    losartan-hydrochlorothiazide 50-12.5 MG tablet Commonly known as: HYZAAR       TAKE these medications    acetaminophen 325 MG tablet Commonly known as: TYLENOL Take 2 tablets (650 mg total) by mouth every 6 (six) hours as needed for mild pain (or Fever >/= 101).   amLODipine 5 MG tablet Commonly known as: NORVASC Take 5 mg by mouth at bedtime.   ciprofloxacin 500 MG tablet Commonly known as: CIPRO Take 1 tablet (500 mg total) by mouth 2 (two) times daily for 10 days.   citalopram 40 MG tablet Commonly known as: CELEXA Take 40 mg by mouth daily.   Mesalamine 800 MG Tbec Take 2 tablets by mouth 3 (three) times daily.   metroNIDAZOLE 500 MG tablet Commonly known as: FLAGYL Take 1 tablet (500 mg total) by mouth every 12 (twelve) hours for 10 days.   Oyster Shell Calcium w/D 500-5 MG-MCG Tabs Take 1 tablet by mouth daily.   pantoprazole 40 MG tablet Commonly known as: PROTONIX Take 40 mg by mouth at bedtime.   predniSONE 10 MG tablet Commonly known as: DELTASONE Take 1 tablet (10 mg total) by mouth daily. Start taking on: August 28, 2021 What changed: These instructions start on August 28, 2021. If you are unsure what to do until then, ask your doctor or other care provider.   pyridostigmine 60 MG tablet Commonly known as: MESTINON Take 60 mg by mouth 3 (three) times daily.        Day of Discharge BP 128/83 (BP Location: Right Arm)   Pulse (!) 54   Temp 97.8 F (36.6 C) (Oral)   Resp 17   Ht 5\' 6"  (1.676 m)   Wt 80.8 kg   SpO2 100%   BMI 28.75 kg/m   Physical Exam: General: No acute  respiratory distress Lungs: Clear to auscultation bilaterally without wheezes or crackles Cardiovascular: Regular rate and rhythm without murmur gallop or rub normal S1 and S2 Abdomen: Nontender, nondistended, soft, bowel sounds positive, no rebound, no ascites, no appreciable mass Extremities: No significant cyanosis, clubbing, or edema bilateral lower extremities  Basic Metabolic Panel: Recent Labs  Lab 08/14/21 1623 08/15/21 0143 08/15/21 0522 08/16/21 0457  NA 138 138 142 143  K 3.0* 3.3* 3.7 4.0  CL 101 104 104 111  CO2 27 29 29 27   GLUCOSE 125* 94 87 101*  BUN 6 10 11 10   CREATININE 0.84 0.91 0.87 0.92  CALCIUM 9.4 8.6* 8.8* 8.6*  MG  --  2.3  --  2.4   CBC: Recent Labs  Lab 08/14/21 1623 08/15/21 0143 08/15/21 0522 08/16/21 0457  WBC 12.7* 9.2 8.7 8.3  NEUTROABS 9.6*  --   --   --   HGB 14.6 13.8 13.6 12.4*  HCT 42.7 41.4 42.0 38.0*  MCV 87.3 89.6 90.5 92.0  PLT 284 246 238 242    Time spent in discharge (includes decision making & examination of pt): 35 minutes  08/17/2021, 3:37 PM   10/16/21, MD [facility] Office  [phone]

## 2021-08-17 NOTE — Progress Notes (Signed)
    Assessment & Plan: HD#4 - Acute diverticulitis with small mural abscess             advance to SOFT diet             IV abx's per medical service             No plans for acute operative intervention, clinically improving on abx.             recommend GI consultation for hx of Crohn's disease, hematochezia    Hx of Crohn's disease Ongoing evaluation for myasthenia gravis - holding steroids at this time          Hosie Spangle, [facility] Surgery A [facility] practice Office: [phone]        Chief Complaint: Acute diverticulitis with mural abscess  Subjective: Patient in bed, no pain.  Tolerating  FLD. Reports flatus and loose BM.  Objective: Vital signs in last 24 hours: Temp:  [97.5 F (36.4 C)-97.7 F (36.5 C)] 97.7 F (36.5 C) (07/10 0532) Pulse Rate:  [44-105] 55 (07/10 0532) Resp:  [16-18] 16 (07/10 0532) BP: (105-139)/(76-86) 105/79 (07/10 0532) SpO2:  [96 %-100 %] 98 % (07/10 0532) Last BM Date : 08/16/21  Intake/Output from previous day: 07/09 0701 - 07/10 0700 In: 2985.7 [P.O.:480; I.V.:2205.7; IV Piggyback:300] Out: 0  Intake/Output this shift: Total I/O In: 120 [P.O.:120] Out: 0   Physical Exam: HEENT - sclerae clear, mucous membranes moist Neck - soft Abdomen - soft without distension; active BS present; mild tenderness LLQ, no mass Ext - no edema, non-tender Neuro - alert & oriented, no focal deficits  Lab Results:  Recent Labs    08/15/21 0522 08/16/21 0457  WBC 8.7 8.3  HGB 13.6 12.4*  HCT 42.0 38.0*  PLT 238 242   BMET Recent Labs    08/15/21 0522 08/16/21 0457  NA 142 143  K 3.7 4.0  CL 104 111  CO2 29 27  GLUCOSE 87 101*  BUN 11 10  CREATININE 0.87 0.92  CALCIUM 8.8* 8.6*   PT/INR No results for input(s): "LABPROT", "INR" in the last 72 hours. Comprehensive Metabolic Panel:    Component Value Date/Time   NA 143 08/16/2021 0457   NA 142 08/15/2021 0522   K 4.0 08/16/2021 0457   K 3.7 08/15/2021  0522   CL 111 08/16/2021 0457   CL 104 08/15/2021 0522   CO2 27 08/16/2021 0457   CO2 29 08/15/2021 0522   BUN 10 08/16/2021 0457   BUN 11 08/15/2021 0522   CREATININE 0.92 08/16/2021 0457   CREATININE 0.87 08/15/2021 0522   GLUCOSE 101 (H) 08/16/2021 0457   GLUCOSE 87 08/15/2021 0522   CALCIUM 8.6 (L) 08/16/2021 0457   CALCIUM 8.8 (L) 08/15/2021 0522   AST 12 (L) 08/16/2021 0457   AST 12 (L) 08/15/2021 0522   ALT 11 08/16/2021 0457   ALT 13 08/15/2021 0522   ALKPHOS 52 08/16/2021 0457   ALKPHOS 60 08/15/2021 0522   BILITOT 0.6 08/16/2021 0457   BILITOT 1.3 (H) 08/15/2021 0522   PROT 6.2 (L) 08/16/2021 0457   PROT 7.0 08/15/2021 0522   ALBUMIN 3.0 (L) 08/16/2021 0457   ALBUMIN 3.4 (L) 08/15/2021 0522    Studies/Results: No results found.    Francine Graven Ajeet Casasola 08/17/2021   Patient ID: Glennie Hawk, male   DOB: 03/19/1970, 51 y.o.   MRN: [id number]

## 2021-09-01 DIAGNOSIS — R300 Dysuria: Secondary | ICD-10-CM | POA: Diagnosis not present

## 2021-09-03 DIAGNOSIS — R300 Dysuria: Secondary | ICD-10-CM | POA: Diagnosis not present

## 2021-09-03 DIAGNOSIS — A09 Infectious gastroenteritis and colitis, unspecified: Secondary | ICD-10-CM | POA: Diagnosis not present

## 2021-09-03 DIAGNOSIS — Z125 Encounter for screening for malignant neoplasm of prostate: Secondary | ICD-10-CM | POA: Diagnosis not present

## 2022-08-13 DIAGNOSIS — G7001 Myasthenia gravis with (acute) exacerbation: Secondary | ICD-10-CM | POA: Diagnosis not present

## 2022-08-16 DIAGNOSIS — R29810 Facial weakness: Secondary | ICD-10-CM | POA: Diagnosis not present

## 2022-08-16 DIAGNOSIS — R6339 Other feeding difficulties: Secondary | ICD-10-CM | POA: Diagnosis not present

## 2022-08-16 DIAGNOSIS — R1310 Dysphagia, unspecified: Secondary | ICD-10-CM | POA: Diagnosis not present

## 2022-08-16 DIAGNOSIS — K5090 Crohn's disease, unspecified, without complications: Secondary | ICD-10-CM | POA: Diagnosis not present

## 2022-08-16 DIAGNOSIS — R001 Bradycardia, unspecified: Secondary | ICD-10-CM | POA: Diagnosis not present

## 2022-08-16 DIAGNOSIS — H532 Diplopia: Secondary | ICD-10-CM | POA: Diagnosis not present

## 2022-08-16 DIAGNOSIS — R1312 Dysphagia, oropharyngeal phase: Secondary | ICD-10-CM | POA: Diagnosis not present

## 2022-08-16 DIAGNOSIS — G7001 Myasthenia gravis with (acute) exacerbation: Secondary | ICD-10-CM | POA: Diagnosis not present

## 2022-08-16 DIAGNOSIS — I6789 Other cerebrovascular disease: Secondary | ICD-10-CM | POA: Diagnosis not present

## 2022-10-01 DIAGNOSIS — G7000 Myasthenia gravis without (acute) exacerbation: Secondary | ICD-10-CM | POA: Diagnosis not present

## 2022-10-18 DIAGNOSIS — Z23 Encounter for immunization: Secondary | ICD-10-CM | POA: Diagnosis not present

## 2022-10-18 DIAGNOSIS — Z13220 Encounter for screening for lipoid disorders: Secondary | ICD-10-CM | POA: Diagnosis not present

## 2022-10-18 DIAGNOSIS — Z0000 Encounter for general adult medical examination without abnormal findings: Secondary | ICD-10-CM | POA: Diagnosis not present

## 2022-10-18 DIAGNOSIS — K5090 Crohn's disease, unspecified, without complications: Secondary | ICD-10-CM | POA: Diagnosis not present

## 2022-10-18 DIAGNOSIS — Z125 Encounter for screening for malignant neoplasm of prostate: Secondary | ICD-10-CM | POA: Diagnosis not present

## 2022-10-18 DIAGNOSIS — I10 Essential (primary) hypertension: Secondary | ICD-10-CM | POA: Diagnosis not present

## 2022-11-05 DIAGNOSIS — G7001 Myasthenia gravis with (acute) exacerbation: Secondary | ICD-10-CM | POA: Diagnosis not present

## 2022-12-16 DIAGNOSIS — K50119 Crohn's disease of large intestine with unspecified complications: Secondary | ICD-10-CM | POA: Diagnosis not present

## 2022-12-16 DIAGNOSIS — K50919 Crohn's disease, unspecified, with unspecified complications: Secondary | ICD-10-CM | POA: Diagnosis not present

## 2023-01-13 DIAGNOSIS — G7001 Myasthenia gravis with (acute) exacerbation: Secondary | ICD-10-CM | POA: Diagnosis not present

## 2023-04-09 DIAGNOSIS — G7000 Myasthenia gravis without (acute) exacerbation: Secondary | ICD-10-CM | POA: Diagnosis not present

## 2023-04-16 DIAGNOSIS — G7000 Myasthenia gravis without (acute) exacerbation: Secondary | ICD-10-CM | POA: Diagnosis not present

## 2023-04-18 DIAGNOSIS — K5090 Crohn's disease, unspecified, without complications: Secondary | ICD-10-CM | POA: Diagnosis not present

## 2023-04-18 DIAGNOSIS — F331 Major depressive disorder, recurrent, moderate: Secondary | ICD-10-CM | POA: Diagnosis not present

## 2023-04-18 DIAGNOSIS — G7000 Myasthenia gravis without (acute) exacerbation: Secondary | ICD-10-CM | POA: Diagnosis not present

## 2023-04-18 DIAGNOSIS — I10 Essential (primary) hypertension: Secondary | ICD-10-CM | POA: Diagnosis not present

## 2023-04-23 DIAGNOSIS — G7000 Myasthenia gravis without (acute) exacerbation: Secondary | ICD-10-CM | POA: Diagnosis not present

## 2023-04-30 DIAGNOSIS — G7000 Myasthenia gravis without (acute) exacerbation: Secondary | ICD-10-CM | POA: Diagnosis not present

## 2023-05-25 DIAGNOSIS — G7000 Myasthenia gravis without (acute) exacerbation: Secondary | ICD-10-CM | POA: Diagnosis not present

## 2023-05-27 DIAGNOSIS — K5010 Crohn's disease of large intestine without complications: Secondary | ICD-10-CM | POA: Diagnosis not present

## 2023-05-27 DIAGNOSIS — K639 Disease of intestine, unspecified: Secondary | ICD-10-CM | POA: Diagnosis not present

## 2023-05-27 DIAGNOSIS — Z7952 Long term (current) use of systemic steroids: Secondary | ICD-10-CM | POA: Diagnosis not present

## 2023-05-27 DIAGNOSIS — K50118 Crohn's disease of large intestine with other complication: Secondary | ICD-10-CM | POA: Diagnosis not present

## 2023-05-27 DIAGNOSIS — G7000 Myasthenia gravis without (acute) exacerbation: Secondary | ICD-10-CM | POA: Diagnosis not present

## 2023-05-27 DIAGNOSIS — K5730 Diverticulosis of large intestine without perforation or abscess without bleeding: Secondary | ICD-10-CM | POA: Diagnosis not present

## 2023-05-27 DIAGNOSIS — K50119 Crohn's disease of large intestine with unspecified complications: Secondary | ICD-10-CM | POA: Diagnosis not present

## 2023-05-27 DIAGNOSIS — R197 Diarrhea, unspecified: Secondary | ICD-10-CM | POA: Diagnosis not present

## 2023-06-01 DIAGNOSIS — G7000 Myasthenia gravis without (acute) exacerbation: Secondary | ICD-10-CM | POA: Diagnosis not present

## 2023-06-04 DIAGNOSIS — G7000 Myasthenia gravis without (acute) exacerbation: Secondary | ICD-10-CM | POA: Diagnosis not present

## 2023-06-08 DIAGNOSIS — G7000 Myasthenia gravis without (acute) exacerbation: Secondary | ICD-10-CM | POA: Diagnosis not present

## 2023-06-09 DIAGNOSIS — K50119 Crohn's disease of large intestine with unspecified complications: Secondary | ICD-10-CM | POA: Diagnosis not present

## 2023-06-09 DIAGNOSIS — K56699 Other intestinal obstruction unspecified as to partial versus complete obstruction: Secondary | ICD-10-CM | POA: Diagnosis not present

## 2023-06-09 DIAGNOSIS — N281 Cyst of kidney, acquired: Secondary | ICD-10-CM | POA: Diagnosis not present

## 2023-06-11 DIAGNOSIS — G7000 Myasthenia gravis without (acute) exacerbation: Secondary | ICD-10-CM | POA: Diagnosis not present

## 2023-06-15 DIAGNOSIS — G7000 Myasthenia gravis without (acute) exacerbation: Secondary | ICD-10-CM | POA: Diagnosis not present

## 2023-06-18 DIAGNOSIS — G7000 Myasthenia gravis without (acute) exacerbation: Secondary | ICD-10-CM | POA: Diagnosis not present

## 2023-06-22 DIAGNOSIS — G7000 Myasthenia gravis without (acute) exacerbation: Secondary | ICD-10-CM | POA: Diagnosis not present

## 2023-06-25 DIAGNOSIS — G7000 Myasthenia gravis without (acute) exacerbation: Secondary | ICD-10-CM | POA: Diagnosis not present

## 2023-06-30 DIAGNOSIS — K2270 Barrett's esophagus without dysplasia: Secondary | ICD-10-CM | POA: Diagnosis not present

## 2023-06-30 DIAGNOSIS — K50119 Crohn's disease of large intestine with unspecified complications: Secondary | ICD-10-CM | POA: Diagnosis not present

## 2023-07-05 DIAGNOSIS — K50119 Crohn's disease of large intestine with unspecified complications: Secondary | ICD-10-CM | POA: Diagnosis not present

## 2023-07-06 DIAGNOSIS — R002 Palpitations: Secondary | ICD-10-CM | POA: Diagnosis not present

## 2023-07-06 DIAGNOSIS — I10 Essential (primary) hypertension: Secondary | ICD-10-CM | POA: Diagnosis not present

## 2023-07-06 DIAGNOSIS — E538 Deficiency of other specified B group vitamins: Secondary | ICD-10-CM | POA: Diagnosis not present

## 2023-07-06 DIAGNOSIS — K50011 Crohn's disease of small intestine with rectal bleeding: Secondary | ICD-10-CM | POA: Diagnosis not present

## 2023-07-23 DIAGNOSIS — G7000 Myasthenia gravis without (acute) exacerbation: Secondary | ICD-10-CM | POA: Diagnosis not present

## 2023-07-25 DIAGNOSIS — G7000 Myasthenia gravis without (acute) exacerbation: Secondary | ICD-10-CM | POA: Diagnosis not present

## 2023-07-30 DIAGNOSIS — G7000 Myasthenia gravis without (acute) exacerbation: Secondary | ICD-10-CM | POA: Diagnosis not present

## 2023-08-01 DIAGNOSIS — G7000 Myasthenia gravis without (acute) exacerbation: Secondary | ICD-10-CM | POA: Diagnosis not present

## 2023-08-06 DIAGNOSIS — G7000 Myasthenia gravis without (acute) exacerbation: Secondary | ICD-10-CM | POA: Diagnosis not present

## 2023-08-08 DIAGNOSIS — G7000 Myasthenia gravis without (acute) exacerbation: Secondary | ICD-10-CM | POA: Diagnosis not present

## 2023-08-13 DIAGNOSIS — G7000 Myasthenia gravis without (acute) exacerbation: Secondary | ICD-10-CM | POA: Diagnosis not present

## 2023-08-15 DIAGNOSIS — G7000 Myasthenia gravis without (acute) exacerbation: Secondary | ICD-10-CM | POA: Diagnosis not present

## 2023-08-23 IMAGING — MR MR HEAD WO/W CM
13 series · 48 of 48 positions shown · IV contrast (multihance)
Comparison: None.

CLINICAL DATA: Word finding difficulty.

EXAM:
MRI HEAD WITHOUT AND WITH CONTRAST
TECHNIQUE: Multiplanar, multiecho pulse sequences of the brain and surrounding
structures were obtained without and with intravenous contrast.
CONTRAST:  19mL MULTIHANCE GADOBENATE DIMEGLUMINE 529 MG/ML IV SOLN

[Series 5: T1 · sagittal · 4.0mm · 0.81mm/px · 1 of 32 slices shown (1 of 3)]
[im 1/32]
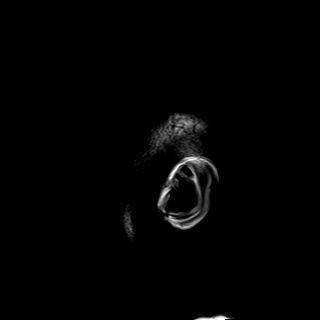

[Series 6: DWI · axial · 3.0mm · 0.98mm/px · z∈[-67,+96]mm · 8 of 187 slices shown (1 of 3)]
[im 1/187]
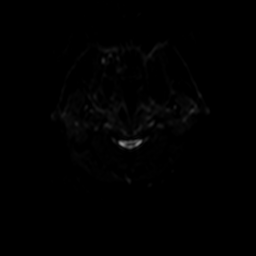
[im 27/187]
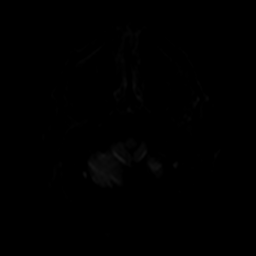
[im 54/187]
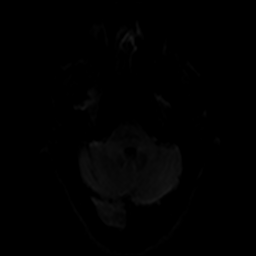
[im 80/187]
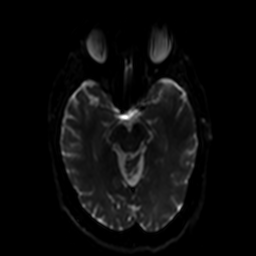
[im 107/187]
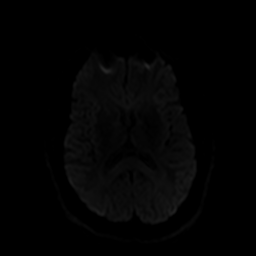
[im 133/187]
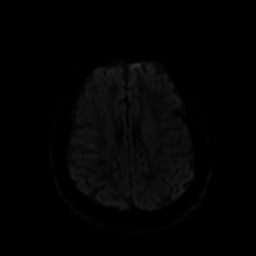
[im 160/187]
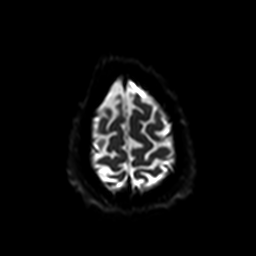
[im 187/187]
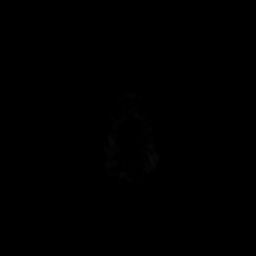

[Series 7: ax dwi_tracew · axial · 3.0mm · 0.98mm/px · z∈[-67,+96]mm · 4 of 94 slices shown]
[im 1/94]
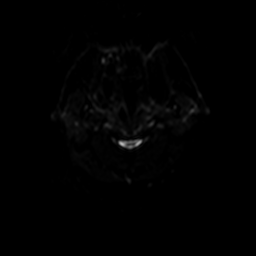
[im 32/94]
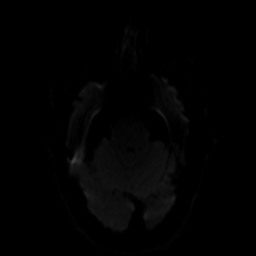
[im 63/94]
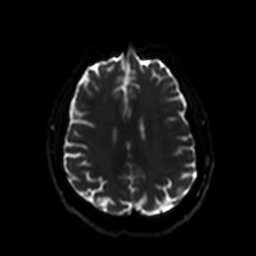
[im 94/94]
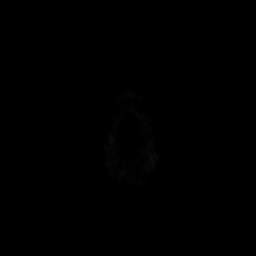

[Series 8: ax dwi_adc · axial · 3.0mm · 0.98mm/px · z∈[-67,+96]mm · 2 of 47 slices shown]
[im 1/47]
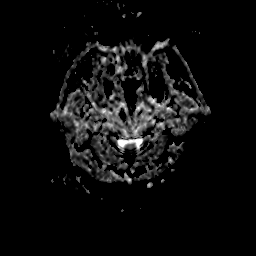
[im 47/47]
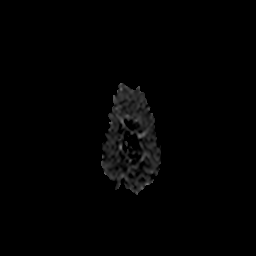

[Series 9: DWI · coronal · 5.0mm · 1.44mm/px · 3 of 70 slices shown (2 of 3)]
[im 1/70]
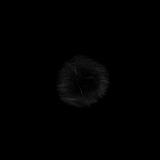
[im 35/70]
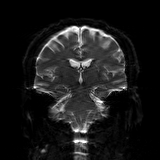
[im 70/70]
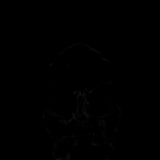

[Series 10: DWI · coronal · 5.0mm · 1.44mm/px · 2 of 35 slices shown (3 of 3)]
[im 1/35]
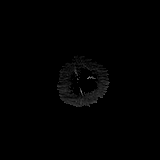
[im 35/35]
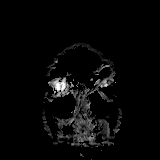

[Series 11: T2 · axial · 4.0mm · 0.39mm/px · z∈[-67,+97]mm · 2 of 33 slices shown]
[im 1/33]
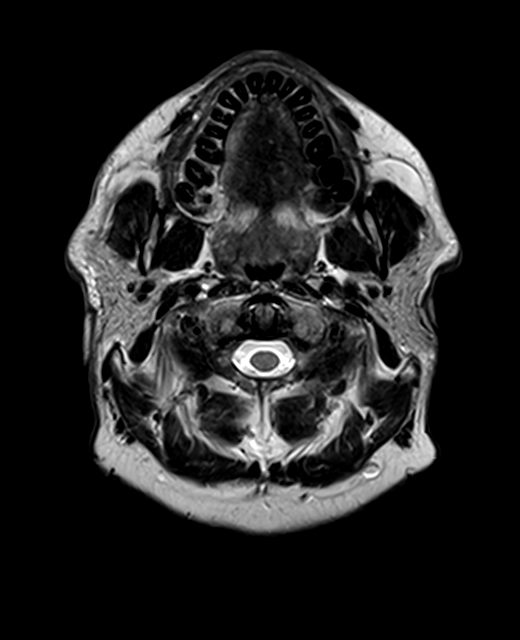
[im 33/33]
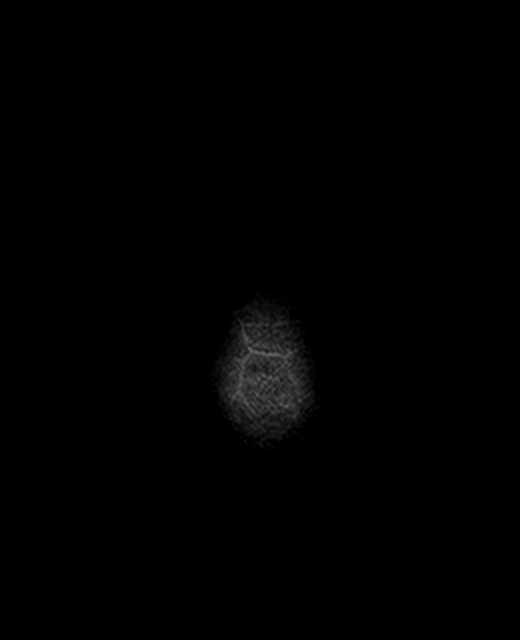

[Series 12: FLAIR · axial · 3.0mm · 0.78mm/px · 1 of 28 slices shown]
[im 1/28]
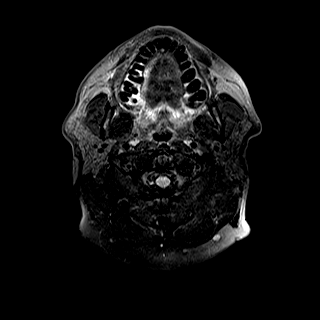

[Series 13: swi_images · axial · 1.5mm · 0.98mm/px · z∈[-67,+97]mm · 5 of 112 slices shown]
[im 1/112]
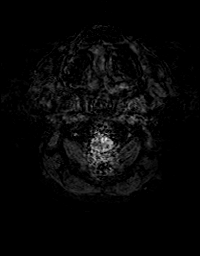
[im 28/112]
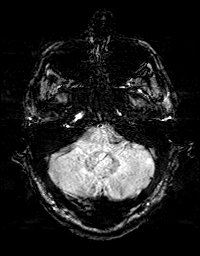
[im 56/112]
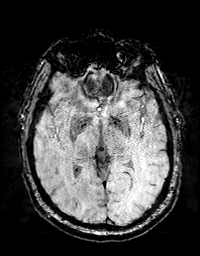
[im 84/112]
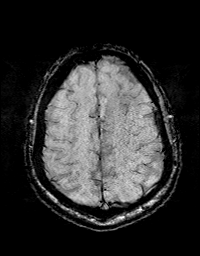
[im 112/112]
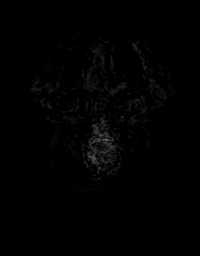

[Series 15: T1 · axial · 1.0mm · 0.98mm/px · z∈[-64,+93]mm · 8 of 160 slices shown (2 of 3)]
[im 1/160]
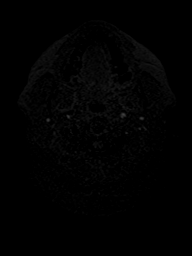
[im 23/160]
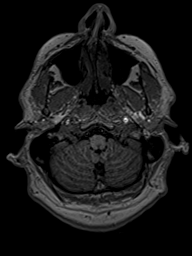
[im 46/160]
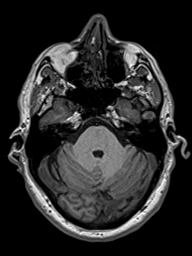
[im 69/160]
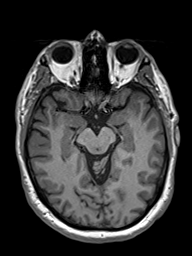
[im 91/160]
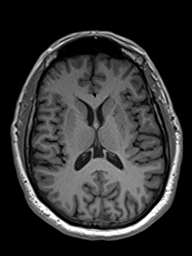
[im 114/160]
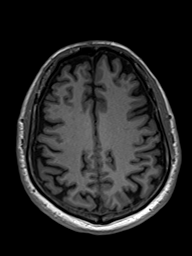
[im 137/160]
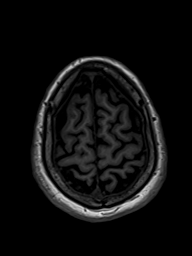
[im 160/160]
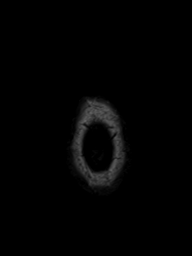

[Series 16: T2 post-contrast · coronal · 4.5mm · 0.36mm/px · 2 of 33 slices shown]
[im 1/33]
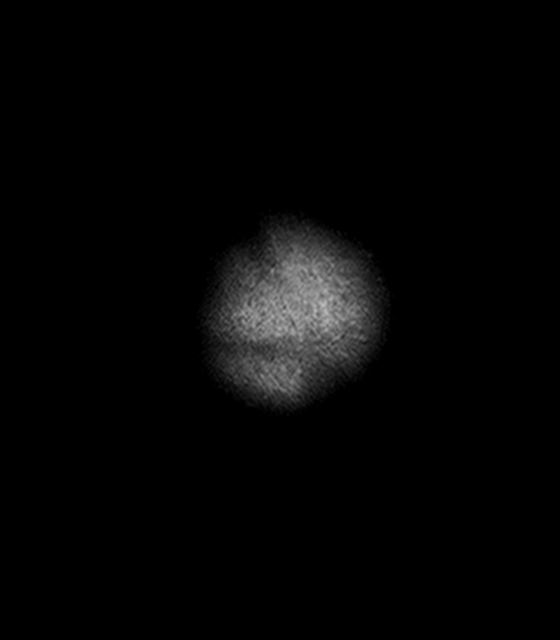
[im 33/33]
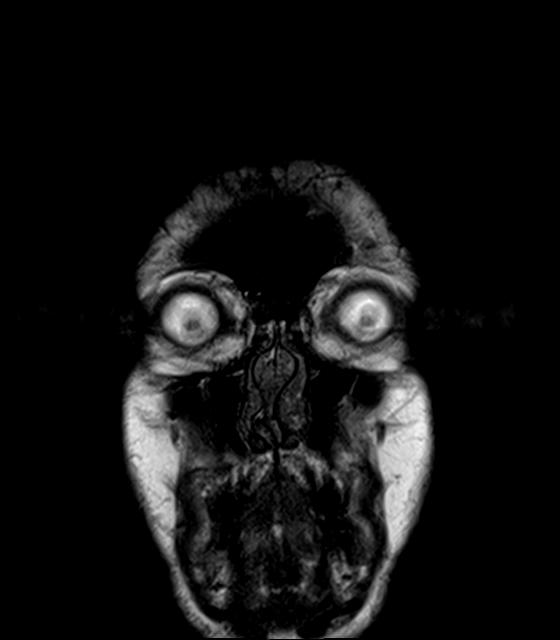

[Series 17: T1 · axial · 1.0mm · 0.98mm/px · z∈[-64,+93]mm · 8 of 160 slices shown (3 of 3)]
[im 1/160]
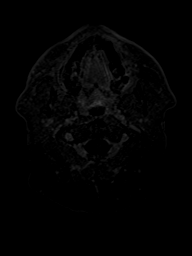
[im 23/160]
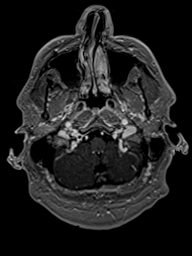
[im 46/160]
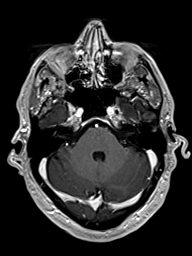
[im 69/160]
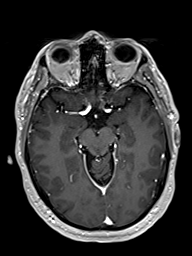
[im 91/160]
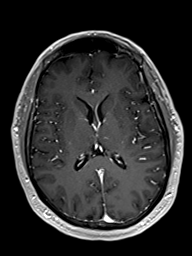
[im 114/160]
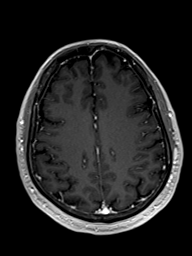
[im 137/160]
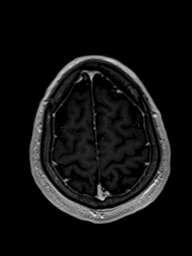
[im 160/160]
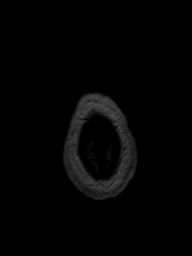

[Series 18: T1 post-contrast · coronal · 4.5mm · 0.72mm/px · 2 of 33 slices shown]
[im 1/33]
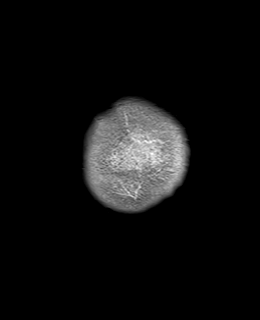
[im 33/33]
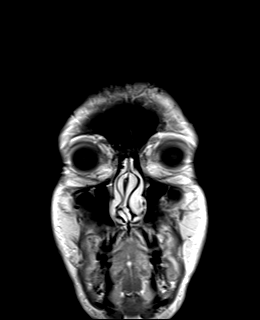

[48 of 48 positions shown; findings below may reference images not displayed]

FINDINGS: Brain: No acute infarction, hemorrhage, hydrocephalus, extra-axial
collection or mass lesion. The brain parenchyma has normal
morphology and signal characteristics. No focus of abnormal contrast
enhancement.

Vascular: Normal flow voids.

Skull and upper cervical spine: Normal marrow signal.

Sinuses/Orbits: Mucous retention cysts in the bilateral maxillary
sinuses. The orbits are maintained.

Other: None.
IMPRESSION: Unremarkable MRI of the brain.

## 2023-09-17 DIAGNOSIS — G7000 Myasthenia gravis without (acute) exacerbation: Secondary | ICD-10-CM | POA: Diagnosis not present

## 2023-09-24 DIAGNOSIS — G7000 Myasthenia gravis without (acute) exacerbation: Secondary | ICD-10-CM | POA: Diagnosis not present

## 2023-10-01 DIAGNOSIS — G7000 Myasthenia gravis without (acute) exacerbation: Secondary | ICD-10-CM | POA: Diagnosis not present

## 2023-10-02 DIAGNOSIS — G7000 Myasthenia gravis without (acute) exacerbation: Secondary | ICD-10-CM | POA: Diagnosis not present

## 2023-10-08 DIAGNOSIS — G7000 Myasthenia gravis without (acute) exacerbation: Secondary | ICD-10-CM | POA: Diagnosis not present

## 2023-10-28 NOTE — Progress Notes (Signed)
 CARDIOLOGY CONSULT NOTE       Patient ID: Roy Ross MRN: [id number] DOB/AGE: 1970/05/19 53 y.o.  Admit date: (Not on file) Referring Physician: Ransom Primary Physician: Ransom Other, MD Primary Cardiologist: New Reason for Consultation: Palpitations  Active Problems:   * No active hospital problems. *   HPI:  53 y.o. referred by Dr Husain for palpitations. History of HTN on norvasc. Former smoker quit in 2008 Sees GI for Crohn's and has myasthenia on mestinon .  This was diagnosed 2023 when he had slurred speech and dysphagia.  Had to stop prednisone  for abdominal infection and hospitalized July 2024 for IVIG. Continues with pyridostigmine  Has had one round of Vyvgart Lately has had palpitations, elevated BP, easy bruising and feeling unsteady. In my mind all these symptoms related to prednisone  Attempt at weaning from April 2025. Discussed avoiding beta blockers with MG.   Echo done 2022 was normal with only trivial MR and AV sclerosis  ECG today SR LAD rate 76 bpm otherwise normal   I used to care for his mom Haris Baack who is 97 and in NH with dementia now  He like to game works for [employer] Since he's been on prednisone  , B12 and iron his palpitations from the spring have resolved     ROS All other systems reviewed and negative except as noted above  Past Medical History:  Diagnosis Date   Abscess    pectoralis major   Hypertension     Family History  Problem Relation Age of Onset   Hypertension Father    Heart disease Father        pacemaker   Diabetes Father    Cancer Father        bladder    Social History   Socioeconomic History   Marital status: Single    Spouse name: Not on file   Number of children: Not on file   Years of education: Not on file   Highest education level: Not on file  Occupational History   Not on file  Tobacco Use   Smoking status: Former    Current packs/day: 0.00    Types: Cigarettes    Quit date: 09/28/2006     Years since quitting: 17.0   Smokeless tobacco: Never  Substance and Sexual Activity   Alcohol use: No   Drug use: No   Sexual activity: Not Currently  Other Topics Concern   Not on file  Social History Narrative   Not on file   Social Drivers of Health   Financial Resource Strain: Not on file  Food Insecurity: Low Risk  (08/16/2022)   Received from [facility]   Hunger Vital Sign    Within the past 12 months, you worried that your food would run out before you got money to buy more: Never true    Within the past 12 months, the food you bought just didn't last and you didn't have money to get more. : Never true  Transportation Needs: No Transportation Needs (08/16/2022)   Received from [facility]    In the past 12 months, has lack of reliable transportation kept you from medical appointments, meetings, work or from getting things needed for daily living? : No  Physical Activity: Not on file  Stress: Not on file  Social Connections: Not on file  Intimate Partner Violence: Not on file    No past surgical history on file.    Current Outpatient Medications:  acetaminophen  (TYLENOL ) 325 MG tablet, Take 2 tablets (650 mg total) by mouth every 6 (six) hours as needed for mild pain (or Fever >/= 101)., Disp: , Rfl:    amLODipine (NORVASC) 5 MG tablet, Take 5 mg by mouth at bedtime., Disp: , Rfl:    Calcium  Carb-Cholecalciferol (OYSTER SHELL CALCIUM  W/D) 500-5 MG-MCG TABS, Take 1 tablet by mouth daily., Disp: , Rfl:    citalopram  (CELEXA ) 40 MG tablet, Take 40 mg by mouth daily., Disp: , Rfl:    Mesalamine  800 MG TBEC, Take 2 tablets by mouth 3 (three) times daily. (Patient not taking: Reported on 08/15/2021), Disp: , Rfl:    pantoprazole  (PROTONIX ) 40 MG tablet, Take 40 mg by mouth at bedtime., Disp: , Rfl:    predniSONE  (DELTASONE ) 10 MG tablet, Take 1 tablet (10 mg total) by mouth daily., Disp: , Rfl:    pyridostigmine  (MESTINON ) 60 MG tablet, Take 60 mg by mouth 3  (three) times daily., Disp: , Rfl:     Physical Exam: There were no vitals taken for this visit.   Affect appropriate Healthy:  appears stated age HEENT: normal Neck supple with no adenopathy JVP normal no bruits no thyromegaly Lungs clear with no wheezing and good diaphragmatic motion Heart:  S1/S2 no murmur, no rub, gallop or click PMI normal Abdomen: benighn, BS positve, no tenderness, no AAA no bruit.  No HSM or HJR Distal pulses intact with no bruits No edema Neuro non-focal Skin warm and dry No muscular weakness   Labs:   Lab Results  Component Value Date   WBC 8.3 08/16/2021   HGB 12.4 (L) 08/16/2021   HCT 38.0 (L) 08/16/2021   MCV 92.0 08/16/2021   PLT 242 08/16/2021   No results for input(s): NA, K, CL, CO2, BUN, CREATININE, CALCIUM , PROT, BILITOT, ALKPHOS, ALT, AST, GLUCOSE in the last 168 hours.  Invalid input(s): LABALBU No results found for: CKTOTAL, CKMB, CKMBINDEX, TROPONINI  Lab Results  Component Value Date   CHOL 166 11/20/2020   Lab Results  Component Value Date   HDL 26 (L) 11/20/2020   Lab Results  Component Value Date   LDLCALC 101 (H) 11/20/2020   Lab Results  Component Value Date   TRIG 225 (H) 11/20/2020   Lab Results  Component Value Date   CHOLHDL 6.4 (H) 11/20/2020   No results found for: LDLDIRECT    Radiology: No results found.  EKG: SR rate 76 LAD normal otherwise    ASSESSMENT AND PLAN:   Palpitations:  benign sounding ECG ok. Update TTE was normal in 2022. Check Hct/TSH  Avoid beta blocker with MG.   MG:  continue to try and wean prednisone  related to a lot of his symptoms and physical signs. On Mestinon  and Vivgart  Croh's  f/u GI using no longer on mesalamine  Has had intraabdominal abscess and diverticulitis with ileal stricture HTN:  continue norvasc  CAD:  risk calcium  score ordered   Echo TSH/Hct Calcium  score   F/U PRN pending tests  Signed: Maude Emmer 10/28/2023, 12:48 PM

## 2023-11-09 ENCOUNTER — Encounter: Payer: Self-pay | Admitting: Cardiovascular Disease

## 2023-11-09 ENCOUNTER — Ambulatory Visit: Attending: Internal Medicine | Admitting: Cardiovascular Disease

## 2023-11-09 VITALS — BP 142/84 | HR 76 | Ht 67.0 in | Wt 184.0 lb

## 2023-11-09 DIAGNOSIS — G7000 Myasthenia gravis without (acute) exacerbation: Secondary | ICD-10-CM | POA: Diagnosis not present

## 2023-11-09 DIAGNOSIS — I10 Essential (primary) hypertension: Secondary | ICD-10-CM | POA: Diagnosis not present

## 2023-11-09 DIAGNOSIS — R002 Palpitations: Secondary | ICD-10-CM | POA: Diagnosis not present

## 2023-11-09 NOTE — Patient Instructions (Addendum)
 Medication Instructions:  Your physician recommends that you continue on your current medications as directed. Please refer to the Current Medication list given to you today.  *If you need a refill on your cardiac medications before your next appointment, please call your pharmacy*  Lab Work: TODAY: TSH, Hematocrit If you have labs (blood work) drawn today and your tests are completely normal, you will receive your results only by: MyChart Message (if you have MyChart) OR A paper copy in the mail If you have any lab test that is abnormal or we need to change your treatment, we will call you to review the results.  Testing/Procedures: CALCIUM  SCORE CT Your physician has requested that you have a coronary calcium  score performed. This is not covered by insurance and will be an out-of-pocket cost of approximately $99.  ECHO Your physician has requested that you have an echocardiogram. Echocardiography is a painless test that uses sound waves to create images of your heart. It provides your doctor with information about the size and shape of your heart and how well your heart's chambers and valves are working. This procedure takes approximately one hour. There are no restrictions for this procedure. Please do NOT wear cologne, perfume, aftershave, or lotions (deodorant is allowed). Please arrive 15 minutes prior to your appointment time.  Please note: We ask at that you not bring children with you during ultrasound (echo/ vascular) testing. Due to room size and safety concerns, children are not allowed in the ultrasound rooms during exams. Our front office staff cannot provide observation of children in our lobby area while testing is being conducted. An adult accompanying a patient to their appointment will only be allowed in the ultrasound room at the discretion of the ultrasound technician under special circumstances. We apologize for any inconvenience.  Follow-Up: At [facility],  you and your health needs are our priority.  As part of our continuing mission to provide you with exceptional heart care, our providers are all part of one team.  This team includes your primary Cardiologist (physician) and Advanced Practice Providers or APPs (Physician Assistants and Nurse Practitioners) who all work together to provide you with the care you need, when you need it.  Your next appointment:   As needed pending testing  Provider:   Maude Emmer, MD   We recommend signing up for the patient portal called MyChart.  Sign up information is provided on this After Visit Summary.  MyChart is used to connect with patients for Virtual Visits (Telemedicine).  Patients are able to view lab/test results, encounter notes, upcoming appointments, etc.  Non-urgent messages can be sent to your provider as well.    To learn more about what you can do with MyChart, go to [email or web address].

## 2023-11-12 DIAGNOSIS — G7000 Myasthenia gravis without (acute) exacerbation: Secondary | ICD-10-CM | POA: Diagnosis not present

## 2023-11-19 DIAGNOSIS — G7000 Myasthenia gravis without (acute) exacerbation: Secondary | ICD-10-CM | POA: Diagnosis not present

## 2023-11-26 DIAGNOSIS — G7000 Myasthenia gravis without (acute) exacerbation: Secondary | ICD-10-CM | POA: Diagnosis not present

## 2023-12-03 DIAGNOSIS — G7000 Myasthenia gravis without (acute) exacerbation: Secondary | ICD-10-CM | POA: Diagnosis not present

## 2023-12-12 ENCOUNTER — Ambulatory Visit (HOSPITAL_COMMUNITY)
Admission: RE | Admit: 2023-12-12 | Discharge: 2023-12-12 | Disposition: A | Discharge status: Home / Self Care | Payer: Self-pay | Source: Ambulatory Visit | Attending: Cardiovascular Disease | Admitting: Cardiovascular Disease

## 2023-12-12 DIAGNOSIS — R002 Palpitations: Secondary | ICD-10-CM

## 2023-12-13 ENCOUNTER — Ambulatory Visit: Payer: Self-pay | Admitting: Cardiovascular Disease

## 2023-12-13 DIAGNOSIS — R931 Abnormal findings on diagnostic imaging of heart and coronary circulation: Secondary | ICD-10-CM

## 2023-12-13 DIAGNOSIS — Z79899 Other long term (current) drug therapy: Secondary | ICD-10-CM

## 2023-12-26 DIAGNOSIS — R931 Abnormal findings on diagnostic imaging of heart and coronary circulation: Secondary | ICD-10-CM | POA: Diagnosis not present

## 2023-12-27 LAB — LIPID PANEL
Chol/HDL Ratio: 3.9 ratio (ref 0.0–5.0)
Cholesterol, Total: 145 mg/dL (ref 100–199)
HDL: 37 mg/dL — ABNORMAL LOW (ref >39)
LDL Chol Calc (NIH): 91 mg/dL (ref 0–99)
Triglycerides: 90 mg/dL (ref 0–149)
VLDL Cholesterol Cal: 17 mg/dL (ref 5–40)

## 2023-12-27 LAB — HEPATIC FUNCTION PANEL
ALT: 15 IU/L (ref 0–44)
AST: 20 IU/L (ref 0–40)
Albumin: 4.6 g/dL (ref 3.8–4.9)
Alkaline Phosphatase: 57 IU/L (ref 47–123)
Bilirubin Total: 0.7 mg/dL (ref 0.0–1.2)
Bilirubin, Direct: 0.22 mg/dL (ref 0.00–0.40)
Total Protein: 6.5 g/dL (ref 6.0–8.5)

## 2023-12-27 MED ORDER — ROSUVASTATIN CALCIUM 5 MG PO TABS: 5.0000 mg | ORAL_TABLET | Freq: Every day | ORAL | 3 refills | Status: AC

## 2024-01-09 ENCOUNTER — Ambulatory Visit (HOSPITAL_COMMUNITY)

## 2024-01-10 DIAGNOSIS — G7000 Myasthenia gravis without (acute) exacerbation: Secondary | ICD-10-CM | POA: Diagnosis not present

## 2024-01-17 DIAGNOSIS — G7000 Myasthenia gravis without (acute) exacerbation: Secondary | ICD-10-CM | POA: Diagnosis not present

## 2024-01-24 DIAGNOSIS — G7000 Myasthenia gravis without (acute) exacerbation: Secondary | ICD-10-CM | POA: Diagnosis not present

## 2024-01-31 DIAGNOSIS — G7000 Myasthenia gravis without (acute) exacerbation: Secondary | ICD-10-CM | POA: Diagnosis not present

## 2024-02-07 DIAGNOSIS — F331 Major depressive disorder, recurrent, moderate: Secondary | ICD-10-CM | POA: Diagnosis not present

## 2024-02-07 DIAGNOSIS — Z23 Encounter for immunization: Secondary | ICD-10-CM | POA: Diagnosis not present

## 2024-02-07 DIAGNOSIS — E538 Deficiency of other specified B group vitamins: Secondary | ICD-10-CM | POA: Diagnosis not present

## 2024-02-07 DIAGNOSIS — Z131 Encounter for screening for diabetes mellitus: Secondary | ICD-10-CM | POA: Diagnosis not present

## 2024-02-07 DIAGNOSIS — I10 Essential (primary) hypertension: Secondary | ICD-10-CM | POA: Diagnosis not present

## 2024-02-07 DIAGNOSIS — Z7952 Long term (current) use of systemic steroids: Secondary | ICD-10-CM | POA: Diagnosis not present

## 2024-02-07 DIAGNOSIS — Z0000 Encounter for general adult medical examination without abnormal findings: Secondary | ICD-10-CM | POA: Diagnosis not present

## 2024-02-07 DIAGNOSIS — G7000 Myasthenia gravis without (acute) exacerbation: Secondary | ICD-10-CM | POA: Diagnosis not present

## 2024-02-14 ENCOUNTER — Ambulatory Visit (HOSPITAL_COMMUNITY)

## 2024-03-14 ENCOUNTER — Ambulatory Visit (HOSPITAL_COMMUNITY)

## 2024-06-11 ENCOUNTER — Ambulatory Visit (HOSPITAL_COMMUNITY)
